# Patient Record
Sex: Female | Born: 1990 | Race: White | Hispanic: No | Marital: Single | Smoking: Current some day smoker
Health system: Southern US, Community
[De-identification: ages and names within clinical notes are randomized; demographics above are authoritative.]

## PROBLEM LIST (undated history)

## (undated) ENCOUNTER — Inpatient Hospital Stay (HOSPITAL_COMMUNITY): Payer: Self-pay

## (undated) DIAGNOSIS — O139 Gestational [pregnancy-induced] hypertension without significant proteinuria, unspecified trimester: Secondary | ICD-10-CM

## (undated) DIAGNOSIS — F419 Anxiety disorder, unspecified: Secondary | ICD-10-CM

## (undated) DIAGNOSIS — F32A Depression, unspecified: Secondary | ICD-10-CM

## (undated) DIAGNOSIS — F329 Major depressive disorder, single episode, unspecified: Secondary | ICD-10-CM

## (undated) HISTORY — DX: Anxiety disorder, unspecified: F41.9

## (undated) HISTORY — DX: Depression, unspecified: F32.A

## (undated) HISTORY — DX: Major depressive disorder, single episode, unspecified: F32.9

## (undated) HISTORY — DX: Gestational (pregnancy-induced) hypertension without significant proteinuria, unspecified trimester: O13.9

---

## 2008-03-28 ENCOUNTER — Inpatient Hospital Stay (HOSPITAL_COMMUNITY): Admission: AD | Admit: 2008-03-28 | Discharge: 2008-03-29 | Payer: Self-pay | Admitting: Obstetrics and Gynecology

## 2008-04-10 ENCOUNTER — Inpatient Hospital Stay (HOSPITAL_COMMUNITY): Admission: AD | Admit: 2008-04-10 | Discharge: 2008-04-13 | Payer: Self-pay | Admitting: Obstetrics and Gynecology

## 2008-04-10 ENCOUNTER — Encounter (INDEPENDENT_AMBULATORY_CARE_PROVIDER_SITE_OTHER): Payer: Self-pay | Admitting: Obstetrics and Gynecology

## 2011-03-29 NOTE — Discharge Summary (Signed)
NAME:  [name] [name], [name] [name]           ACCOUNT NO.:  [id number]   MEDICAL RECORD NO.:  [id number]          PATIENT TYPE:  INP   LOCATION:  9155                          FACILITY:  [facility]   PHYSICIAN:  Kendra H. Tenny Craw, MD     DATE OF BIRTH:  Sep 01, 1991   DATE OF ADMISSION:  03/28/2008  DATE OF DISCHARGE:  03/29/2008                               DISCHARGE SUMMARY   .   HOSPITAL DIAGNOSES:  1. Gestational hypertension.  2. A 34 and 6-week intrauterine pregnancy.   HOSPITAL PROCEDURES:  1. Ultrasound for fetal weight and amniotic fluid index.  2. Serial blood pressure monitoring.  3. Preeclampsia labs.  4. A 24-hour urine.  5. Continuous fetal monitoring.   HOSPITAL COURSE:  [name] is an 20 year old gravida 2, para 0-0-1-0  who was admitted on Mar 28, 2008, at 62 weeks' and 5 days gestational  age after being noted to have elevated blood pressures in the office of  148 over 90s to 110s.  She was completely asymptomatic at that time but  given that she had previously been normotensive during the pregnancy,  the decision was made to admit her for a 24-hour urine and serial blood  pressure monitoring for evaluation of possible preeclampsia.  Preeclampsia labs during this hospitalization were completely normal.  Blood pressures remained 130s to 140s over 80s to low 100.  A 24-hour  urine did return at 105 mg of protein for a 24-hour period.  During the  hospitalization, she did have headaches on 2 occasions which completely  resolved after taking oral Tylenol.  The transabdominal ultrasound which  was performed demonstrated an estimated fetal weight of 2132 g, 4 pounds  11 ounces which is 30th percentile with an amniotic fluid index of 14.8  and a cephalic presentation.  External fetal monitoring was reactive  without evidence of decelerations and no contractions were noted on the  monitor.  The results of the blood work and monitoring were discussed  with the patient on the evening of  Mar 29, 2008.  Given the normal blood  work and normal 24-hour urine and only elevated blood pressures, it was  felt that she had gestational hypertension and preeclampsia at this  time.  The patient does understand that preeclampsia can develop at any  time and it is a spectrum of disease.  She was instructed in the signs  and symptoms to be aware of and for need to return to the hospital.  She  will be started on labetalol 200 mg p.o. twice daily.  She will be  discharged on Mar 29, 2008, and start a 24-hour urine on Mar 30, 2008,  to be brought into the office on Mar 31, 2008.  She understands she will  need twice weekly monitoring.   LABS AT THE TIME OF DISCHARGE:  A 24-hour urine 105 mg of protein,  sodium 138, potassium 3.8, chloride 108, CO2 21, BUN 5, and creatinine  0.49.  AST 11, ALT 11, LDH 166, and uric acid 5.8.   DISCHARGE MEDICATIONS:  Labetalol 200 mg p.o. b.i.d.      Freddrick March.  Tenny Craw, MD  Electronically Signed     KHR/MEDQ  D:  03/29/2008  T:  03/30/2008  Job:  [id number]

## 2011-03-29 NOTE — Op Note (Signed)
NAME:  [name], [name]           ACCOUNT NO.:  [id number]   MEDICAL RECORD NO.:  [id number]          PATIENT TYPE:  INP   LOCATION:  9119                          FACILITY:  [facility]   PHYSICIAN:  Kendra H. Tenny Craw, MD     DATE OF BIRTH:  Apr 07, 1991   DATE OF PROCEDURE:  04/10/2008  DATE OF DISCHARGE:                               OPERATIVE REPORT   PREOPERATIVE DIAGNOSES:  1. A 36 and 4-week intrauterine pregnancy.  2. Gestational hypertension.  3. Nonreassuring fetal well being.   POSTOPERATIVE DIAGNOSES:  1. A 36 and 4-week intrauterine pregnancy.  2. Gestational hypertension.  3. Nonreassuring fetal well being.   PROCEDURE:  Primary low transverse cesarean section via Pfannenstiel  skin incision.   SURGEON:  Freddrick March. Tenny Craw, MD.   ASSISTANTLarene Beach, surgical tech   ANESTHESIA:  Epidural.   FINDINGS:  Vigorous female infant in the occiput posterior brow  presentation with Apgar scores of 8 at one minute and 9 at five minutes,  weighing 5 pounds 1 ounce.  Normal-appearing ovaries, tubes, and uterus.   PROCEDURE:  [name] [name] is an 20 year old G2, P0 at 8 weeks and 4 days  estimated gestational age who has been followed over the past 2 weeks  for gestational hypertension.  During the beginning of her pregnancy,  she was normotensive.  However, at her 34-week visit, she was noted to  have elevated blood pressures of 140/110.  She was admitted to the  hospital at this time for serial blood pressures and preeclampsia  evaluation.  A 24-hour urine was within normal limits at that time,  preeclampsia labs were normal.  Pressures remained elevated at 140s-  150s/90s-100s.  She was started on labetalol at that time.  She was  monitored for the next 2 weeks and continued to have elevated blood  pressures despite elevation of her labetalol dose to 400 mg twice daily.  So, the decision was made to proceed with induction of labor for just  worsening gestational hypertension.  She was  admitted early in the  morning on Apr 10, 2008, started with misoprostol induction, she made it  to 4 to 5 cm dilated.  However, after amniotomy was performed, the fetus  was noted to have intermittent variable decelerations with contractions.  Overall, scalp stimulation would demonstrate reassuring fetal well  being, but as time progressed, labor was adequate and these variables  began to become more pronounced and severe, and the decision was made to  proceed with a primary low transverse cesarean section for nonreassuring  fetal status.  Following the appropriate informed consent, the patient  was brought to the operating room where epidural anesthesia was found to  be adequate.  She was placed in the dorsal supine position in leftward  tilt and prepped and draped in the normal sterile fashion.  A scalpel  was used to make a Pfannenstiel skin incision, which was carried down  through the underlying layers of soft tissue to the fascia.  The fascia  was incised in the midline.  Fascial incision was extended laterally  with Mayo scissors.  Superior aspect of  the fascial incision was grasped  with Kocher clamps x2, tented up underlying rectus muscle was dissected  off sharply with the electrocautery unit.  The same procedure was  repeated on the inferior aspect of the fascial incision.  The rectus  muscles were then separated in the midline.  Abdominal peritoneum was  identified and entered sharply with the Metzenbaum.  The incision was  extended superiorly and inferiorly with good visualization of the  bladder.  The Alexis retractor was then inserted and erected.  The  vesicouterine peritoneum was identified, tented up, entered sharply with  the Metzenbaum, and the incision was extended laterally with the  Metzenbaum, and the bladder flap was created digitally.  A scalpel was  then used to make a low transverse incision on the uterus, which was  extended laterally with blunt dissection.   Upon making of the uterine  incision, the infant's chin, lips, and nose were immediately on the  incision, consistent with brow presentation.  The infant's head was  rotated to allow the occiput to be delivered first, followed by the  body.  No nuchal cord was noted at the time of delivery.  The infant  cried vigorously on the operative field.  Cord was clamped and cut, and  infant was passed to the awaiting pediatricians.  Placenta was then  spontaneously delivered.  The uterus was exteriorized and cleared of all  clot and debris.  Uterine incision was then repaired with #1 chromic in  a running locked fashion with a second imbricating layer.  The uterine  incision was inspected and found to be hemostatic.  The ovaries and  tubes were inspected and found to be within normal limits.  The uterus  was then returned to the abdominal cavity.  The abdominal cavity was  cleared of all clot and debris.  Uterine incision was reinspected and  found to be hemostatic.  The Alexis retractor was disassembled.  The  abdominal peritoneum was then reapproximated with 2-0 Vicryl.  The  rectus muscles were reapproximated with two figure-of-eight sutures of 2-  0 Vicryl.  The fascia was closed with 0-looped PDS and the skin was  closed with staples.  All sponge, lap, and needle counts were correct  x2.  The patient tolerated the procedure well and was brought to the  recovery room in stable condition following the procedure.      Freddrick March. Tenny Craw, MD  Electronically Signed     KHR/MEDQ  D:  04/10/2008  T:  04/11/2008  Job:  [id number]

## 2011-04-01 NOTE — Discharge Summary (Signed)
[name] [name], FAULDS           ACCOUNT NO.:  [id number]   MEDICAL RECORD NO.:  [id number]          PATIENT TYPE:  INP   LOCATION:  9119                          FACILITY:  [facility]   PHYSICIAN:  Randye Lobo, M.D.   DATE OF BIRTH:  1991-05-07   DATE OF ADMISSION:  04/10/2008  DATE OF DISCHARGE:  04/13/2008                               DISCHARGE SUMMARY   FINAL DIAGNOSES:  1. Intrauterine pregnancy at 36-4/[redacted] weeks gestation.  2. Gestational hypertension.  3. Induction of labor.  4. Nonreassuring fetal well-being.   PROCEDURE:  Primary low transverse cesarean section.   SURGEON:  Freddrick March. Tenny Craw, M.D.   ASSISTANTChip Boer as surgical technician.   COMPLICATIONS:  None.   INDICATIONS FOR PROCEDURE:  This 20 year old gravida 2, para 0, presents  at 36-4/[redacted] weeks gestation for an induction secondary to gestational  hypertension.  The patient has been followed ever since about [redacted] weeks  gestation with elevated blood pressures.  She has been admitted to the  hospital for serial blood pressures and preeclampsia evaluation.  She  had normal PIH labs and normal 24-hour urine.  The patient's blood  pressure stayed elevated and she was started on labetalol at that time.  She has continued to be monitored closely.  At this point the decision  was made to proceed with induction of labor secondary to the worsening  of her gestational hypertension.  Besides this current problem, the  patient's antepartum course up to about 34 weeks was uncomplicated.  She  did have a positive Group B Strep culture obtained in our office around  34 weeks.  The patient was admitted on May 28, started on misoprostol  induction.  She dilated about 4-5 cm, but after amniotomy was performed  the fetus was noted to have intermittent variable decelerations with her  contractions.  A discussion was held with the patient as the variables  became more severe and the decision was made to proceed with a cesarean  section  at this time.  The patient was still remote from delivery.  The  patient was taken to the operating room on Apr 10, 2008, by Dr. Tenny Craw for  a primary low transverse cesarean section which was performed with  delivery of a 5 pound 1 ounce female infant with Apgars of 8 and 9.  Delivery went without complications.  The patient's postoperative course  was benign without any significant fever.  She did continue to have some  elevated blood pressures and was kept on labetalol b.i.d.  She did want  her little boy circumcised prior to discharge.  She was felt ready for  discharge herself on postoperative day #2-1/2.  She was sent home on a  regular diet, told to decrease activities, told to continue her prenatal  vitamins, was given Percocet 1-2 every 4-6 hours as needed for pain,  told she could use ibuprofen up to 600 mg every 6 hours as needed for  pain.  She was to continue her labetalol as prescribed and was to follow  up in our office in 1 week for a blood pressure check.  Instructions and  precautions were reviewed with the patient.  The patient later reported  that she was unable to take Percocet.  Dr. Edward Jolly was made aware and  called in a different pain medicine for the patient.   DISCHARGE LABORATORY DATA:  Hemoglobin 10.8, white blood cell count  18.0, platelets 198,000.  Normal PIH panel.      Leilani Able, P.A.-C.      Randye Lobo, M.D.  Electronically Signed    MB/MEDQ  D:  05/07/2008  T:  05/07/2008  Job:  [id number]

## 2011-08-10 LAB — CBC
HCT: 30.9 — ABNORMAL LOW
HCT: 36.9
HCT: 37
HCT: 38.3
HCT: 39.3
Hemoglobin: 10.8 — ABNORMAL LOW
Hemoglobin: 12.8
Hemoglobin: 13
Hemoglobin: 13.5
Hemoglobin: 13.9
MCHC: 34.8
MCHC: 35
MCHC: 35
MCHC: 35.2
MCHC: 35.4
MCV: 90.9
MCV: 91.4
MCV: 91.9
MCV: 92
MCV: 92.5
Platelets: 198
Platelets: 238
Platelets: 252
Platelets: 252
Platelets: 255
RBC: 3.34 — ABNORMAL LOW
RBC: 4.01
RBC: 4.02
RBC: 4.21
RBC: 4.3
RDW: 13
RDW: 13.3
RDW: 13.4
RDW: 13.5
RDW: 13.8
WBC: 11.4 — ABNORMAL HIGH
WBC: 11.7 — ABNORMAL HIGH
WBC: 12.3 — ABNORMAL HIGH
WBC: 12.7 — ABNORMAL HIGH
WBC: 18 — ABNORMAL HIGH

## 2011-08-10 LAB — COMPREHENSIVE METABOLIC PANEL
ALT: 11
ALT: 13
AST: 10
AST: 12
Albumin: 2.6 — ABNORMAL LOW
Albumin: 2.8 — ABNORMAL LOW
Alkaline Phosphatase: 164 — ABNORMAL HIGH
BUN: 4 — ABNORMAL LOW
BUN: 5 — ABNORMAL LOW
CO2: 21
Calcium: 8.8
Calcium: 9.1
Chloride: 105
Chloride: 106
Creatinine, Ser: 0.49
Creatinine, Ser: 0.51
GFR calc Af Amer: >60
GFR calc non Af Amer: >60
Glucose, Bld: 106 — ABNORMAL HIGH
Potassium: 3.9
Sodium: 136
Total Bilirubin: 0.6

## 2011-08-10 LAB — URIC ACID: Uric Acid, Serum: 5.8

## 2011-08-10 LAB — LACTATE DEHYDROGENASE: LDH: 166

## 2011-08-10 LAB — CREATININE CLEARANCE, URINE, 24 HOUR
Collection Interval-CRCL: 24
Creatinine, 24H Ur: 740
Creatinine: 0.51
Urine Total Volume-CRCL: 1050

## 2011-08-10 LAB — RPR: RPR Ser Ql: NONREACTIVE

## 2015-04-17 ENCOUNTER — Emergency Department (HOSPITAL_COMMUNITY): Payer: No Typology Code available for payment source

## 2015-04-17 ENCOUNTER — Emergency Department (HOSPITAL_COMMUNITY)
Admission: EM | Admit: 2015-04-17 | Discharge: 2015-04-18 | Disposition: A | Discharge status: Home / Self Care | Payer: No Typology Code available for payment source | Attending: Emergency Medicine | Admitting: Emergency Medicine

## 2015-04-17 ENCOUNTER — Encounter (HOSPITAL_COMMUNITY): Payer: Self-pay | Admitting: *Deleted

## 2015-04-17 DIAGNOSIS — Y92410 Unspecified street and highway as the place of occurrence of the external cause: Secondary | ICD-10-CM | POA: Insufficient documentation

## 2015-04-17 DIAGNOSIS — Y9389 Activity, other specified: Secondary | ICD-10-CM | POA: Insufficient documentation

## 2015-04-17 DIAGNOSIS — S59902A Unspecified injury of left elbow, initial encounter: Secondary | ICD-10-CM | POA: Diagnosis not present

## 2015-04-17 DIAGNOSIS — T148 Other injury of unspecified body region: Secondary | ICD-10-CM | POA: Insufficient documentation

## 2015-04-17 DIAGNOSIS — R55 Syncope and collapse: Secondary | ICD-10-CM | POA: Insufficient documentation

## 2015-04-17 DIAGNOSIS — S299XXA Unspecified injury of thorax, initial encounter: Secondary | ICD-10-CM | POA: Insufficient documentation

## 2015-04-17 DIAGNOSIS — S3992XA Unspecified injury of lower back, initial encounter: Secondary | ICD-10-CM | POA: Insufficient documentation

## 2015-04-17 DIAGNOSIS — S59912A Unspecified injury of left forearm, initial encounter: Secondary | ICD-10-CM | POA: Diagnosis not present

## 2015-04-17 DIAGNOSIS — S6992XA Unspecified injury of left wrist, hand and finger(s), initial encounter: Secondary | ICD-10-CM | POA: Diagnosis not present

## 2015-04-17 DIAGNOSIS — S8991XA Unspecified injury of right lower leg, initial encounter: Secondary | ICD-10-CM | POA: Insufficient documentation

## 2015-04-17 DIAGNOSIS — Y999 Unspecified external cause status: Secondary | ICD-10-CM | POA: Insufficient documentation

## 2015-04-17 DIAGNOSIS — S199XXA Unspecified injury of neck, initial encounter: Secondary | ICD-10-CM | POA: Insufficient documentation

## 2015-04-17 MED ORDER — HYDROCODONE-ACETAMINOPHEN 5-325 MG PO TABS
2.0000 | ORAL_TABLET | Freq: Once | ORAL | Status: AC
  Administered 2015-04-17: 2 via ORAL
  Filled 2015-04-17: qty 2
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
## 2015-04-18 MED ORDER — HYDROCODONE-ACETAMINOPHEN 5-325 MG PO TABS: 1.0000 | ORAL_TABLET | Freq: Four times a day (QID) | ORAL | Status: DC | PRN

## 2015-04-18 NOTE — Discharge Instructions (Signed)

## 2016-03-10 ENCOUNTER — Emergency Department (HOSPITAL_COMMUNITY): Payer: Medicaid Other

## 2016-03-10 ENCOUNTER — Encounter (HOSPITAL_COMMUNITY): Payer: Self-pay | Admitting: *Deleted

## 2016-03-10 ENCOUNTER — Emergency Department (HOSPITAL_COMMUNITY)
Admission: EM | Admit: 2016-03-10 | Discharge: 2016-03-10 | Disposition: A | Discharge status: Home / Self Care | Payer: Medicaid Other | Attending: Emergency Medicine | Admitting: Emergency Medicine

## 2016-03-10 DIAGNOSIS — N390 Urinary tract infection, site not specified: Secondary | ICD-10-CM

## 2016-03-10 DIAGNOSIS — O2342 Unspecified infection of urinary tract in pregnancy, second trimester: Secondary | ICD-10-CM | POA: Insufficient documentation

## 2016-03-10 DIAGNOSIS — R000 Tachycardia, unspecified: Secondary | ICD-10-CM | POA: Diagnosis not present

## 2016-03-10 DIAGNOSIS — Z3A20 20 weeks gestation of pregnancy: Secondary | ICD-10-CM | POA: Insufficient documentation

## 2016-03-10 DIAGNOSIS — R102 Pelvic and perineal pain: Secondary | ICD-10-CM

## 2016-03-10 DIAGNOSIS — O9989 Other specified diseases and conditions complicating pregnancy, childbirth and the puerperium: Secondary | ICD-10-CM | POA: Diagnosis present

## 2016-03-10 LAB — LIPASE, BLOOD: LIPASE: 20 U/L (ref 11–51)

## 2016-03-10 LAB — COMPREHENSIVE METABOLIC PANEL
ALT: 12 U/L — ABNORMAL LOW (ref 14–54)
ANION GAP: 10 (ref 5–15)
AST: 9 U/L — ABNORMAL LOW (ref 15–41)
Albumin: 3.2 g/dL — ABNORMAL LOW (ref 3.5–5.0)
Alkaline Phosphatase: 65 U/L (ref 38–126)
BUN: 6 mg/dL (ref 6–20)
CHLORIDE: 104 mmol/L (ref 101–111)
CO2: 22 mmol/L (ref 22–32)
Calcium: 9.3 mg/dL (ref 8.9–10.3)
Creatinine, Ser: 0.44 mg/dL (ref 0.44–1.00)
GFR calc Af Amer: >60 mL/min (ref >60)
GFR calc non Af Amer: >60 mL/min (ref >60)
Glucose, Bld: 80 mg/dL (ref 65–99)
POTASSIUM: 4.4 mmol/L (ref 3.5–5.1)
SODIUM: 136 mmol/L (ref 135–145)
Total Bilirubin: 0.4 mg/dL (ref 0.3–1.2)
Total Protein: 6.8 g/dL (ref 6.5–8.1)

## 2016-03-10 LAB — CBC
HCT: 39.3 % (ref 36.0–46.0)
HEMOGLOBIN: 13.5 g/dL (ref 12.0–15.0)
MCH: 31 pg (ref 26.0–34.0)
MCHC: 34.4 g/dL (ref 30.0–36.0)
MCV: 90.1 fL (ref 78.0–100.0)
Platelets: 342 10*3/uL (ref 150–400)
RBC: 4.36 MIL/uL (ref 3.87–5.11)
RDW: 13.1 % (ref 11.5–15.5)
WBC: 17 10*3/uL — AB (ref 4.0–10.5)

## 2016-03-10 LAB — URINALYSIS, ROUTINE W REFLEX MICROSCOPIC
Bilirubin Urine: NEGATIVE
GLUCOSE, UA: NEGATIVE mg/dL
HGB URINE DIPSTICK: NEGATIVE
Ketones, ur: NEGATIVE mg/dL
LEUKOCYTES UA: NEGATIVE
Nitrite: POSITIVE — AB
Protein, ur: NEGATIVE mg/dL
SPECIFIC GRAVITY, URINE: 1.013 (ref 1.005–1.030)
pH: 7.5 (ref 5.0–8.0)

## 2016-03-10 LAB — I-STAT BETA HCG BLOOD, ED (MC, WL, AP ONLY)

## 2016-03-10 LAB — URINE MICROSCOPIC-ADD ON

## 2016-03-10 MED ORDER — CEPHALEXIN 500 MG PO CAPS: 500.0000 mg | ORAL_CAPSULE | Freq: Four times a day (QID) | ORAL | Status: DC

## 2016-03-10 MED ORDER — DEXTROSE 5 % IV SOLN
1.0000 g | Freq: Once | INTRAVENOUS | Status: AC
  Administered 2016-03-10: 1 g via INTRAVENOUS
  Filled 2016-03-10: qty 10

## 2016-03-10 MED ORDER — ACETAMINOPHEN 500 MG PO TABS
1000.0000 mg | ORAL_TABLET | Freq: Once | ORAL | Status: AC
  Administered 2016-03-10: 1000 mg via ORAL
  Filled 2016-03-10: qty 2

## 2016-03-10 MED ORDER — SODIUM CHLORIDE 0.9 % IV BOLUS (SEPSIS)
1000.0000 mL | Freq: Once | INTRAVENOUS | Status: AC
  Administered 2016-03-10: 1000 mL via INTRAVENOUS

## 2016-03-10 NOTE — ED Provider Notes (Signed)
CSN: [id number]     Arrival date & time 03/10/16  1235 History   First MD Initiated Contact with Patient 03/10/16 1323     Chief Complaint  Patient presents with  . Abdominal Pain     (Consider location/radiation/quality/duration/timing/severity/associated sxs/prior Treatment) HPI Comments: 25 year old female G4 P3 presents for abdominal pain. The patient states that her last menstrual period she believes was December 15 and although she is not sure if it was normal she thinks it probably was. She believes herself to be about 19-[redacted] weeks gestation. She has not had any prenatal care secondary to insurance issues. She reports that her other deliveries were by C-section after the first one was an emergent delivery secondary to preeclampsia. She denies any vaginal bleeding or gushes of fluid. She reports that she does have intermittent cramping pains in her back like a menstrual period. She says that her urine has been very dark and foul-smelling. She also reports that she has at times had difficulty initiating urination. She denies known fever. She has been nauseous and has vomited on occasion. She reports bilateral flank pain worse on the left that is intermittent and seems to wax and wane.  Patient is a 25 y.o. female presenting with abdominal pain.  Abdominal Pain Associated symptoms: dysuria   Associated symptoms: no chest pain, no chills, no fatigue, no fever and no vaginal bleeding     History reviewed. No pertinent past medical history. History reviewed. No pertinent past surgical history. No family history on file. Social History  Substance Use Topics  . Smoking status: Never Smoker   . Smokeless tobacco: None  . Alcohol Use: Yes   OB History    No data available     Review of Systems  Constitutional: Negative for fever, chills and fatigue.  HENT: Negative for congestion, postnasal drip, rhinorrhea and sinus pressure.   Cardiovascular: Negative for chest pain and palpitations.   Gastrointestinal: Positive for abdominal pain.  Genitourinary: Positive for dysuria and flank pain (bilateral, intermittent). Negative for urgency, decreased urine volume, vaginal bleeding, vaginal pain and menstrual problem.  Musculoskeletal: Negative for myalgias and back pain.  Skin: Negative for rash.  Neurological: Negative for dizziness, weakness and headaches.  Hematological: Does not bruise/bleed easily.      Allergies  Morphine and related  Home Medications   Prior to Admission medications   Medication Sig Start Date End Date Taking? Authorizing Provider  HYDROcodone-acetaminophen (NORCO/VICODIN) 5-325 MG per tablet Take 1-2 tablets by mouth every 6 (six) hours as needed. 04/18/15   Montine Circle, PA-C   BP 118/83 mmHg  Pulse 112  Temp(Src) 97.7 F (36.5 C) (Oral)  Resp 16  SpO2 100%  LMP 11/10/2015 Physical Exam  Constitutional: She is oriented to person, place, and time. She appears well-developed and well-nourished. No distress.  HENT:  Head: Normocephalic and atraumatic.  Right Ear: External ear normal.  Left Ear: External ear normal.  Nose: Nose normal.  Mouth/Throat: Oropharynx is clear and moist. No oropharyngeal exudate.  Eyes: EOM are normal. Pupils are equal, round, and reactive to light.  Neck: Normal range of motion. Neck supple.  Cardiovascular: Regular rhythm, normal heart sounds and intact distal pulses.  Tachycardia present.   No murmur heard. Pulmonary/Chest: Effort normal. No respiratory distress. She has no wheezes. She has no rales.  Abdominal: Soft. There is no tenderness. There is no CVA tenderness.  Gravid to around the umbilicus.    Musculoskeletal: Normal range of motion. She exhibits no edema or  tenderness.  Neurological: She is alert and oriented to person, place, and time.  Skin: Skin is warm and dry. No rash noted. She is not diaphoretic.  Vitals reviewed.   ED Course  Procedures (including critical care time) Labs Review Labs  Reviewed  COMPREHENSIVE METABOLIC PANEL - Abnormal; Notable for the following:    Albumin 3.2 (*)    AST 9 (*)    ALT 12 (*)    All other components within normal limits  CBC - Abnormal; Notable for the following:    WBC 17.0 (*)    All other components within normal limits  I-STAT BETA HCG BLOOD, ED ([facility], [facility], [facility] ONLY) - Abnormal; Notable for the following:    I-stat hCG, quantitative >2000.0 (*)    All other components within normal limits  LIPASE, BLOOD  URINALYSIS, ROUTINE W REFLEX MICROSCOPIC (NOT AT [facility])    Imaging Review No results found. I have personally reviewed and evaluated these images and lab results as part of my medical decision-making.   EKG Interpretation None      MDM  Patient was seen and evaluated in stable condition.  Bedside US with IUP and FHR in the 150s.  [facility].  UA consistent with infection.  Renal US without sign of kidney stone.  OB US with dating about 51 w 1 day.  Discussed again with Dr. Ihor Dow at [facility] who recommended IV dose of antibiotics and discharge with prescription.  She said she would facilitate an outpatient initial OB appointment for the patient.  Discussed plan of care with patient who was in agreement.  Patient to be discharged with Keflex.  Strict return precautions given. Final diagnoses:  None    1. UTI/early pyelo    Harvel Quale, MD 03/10/16 [phone]

## 2016-03-10 NOTE — Discharge Instructions (Signed)
You were seen and evaluated today for your pain and change in urination.  This appears likely secondary to a UTI/early kidney infection.  If your symptoms worsen or you are not able to hold down your antibiotics or you develop fever then follow up at the [facility] hospital.  The OB office from the women's hospital should be contacting you to arrange an initial OB appointment.  Pyelonephritis, Adult Pyelonephritis is a kidney infection. The kidneys are the organs that filter a person's blood and move waste out of the bloodstream and into the urine. Urine passes from the kidneys, through the ureters, and into the bladder. There are two main types of pyelonephritis:  Infections that come on quickly without any warning (acute pyelonephritis).  Infections that last for a long period of time (chronic pyelonephritis). In most cases, the infection clears up with treatment and does not cause further problems. More severe infections or chronic infections can sometimes spread to the bloodstream or lead to other problems with the kidneys. CAUSES This condition is usually caused by:  Bacteria traveling from the bladder to the kidney through infected urine. The urine in the bladder can become infected with bacteria from:  Bladder infection (cystitis).  Inflammation of the prostate gland (prostatitis).  Sexual intercourse, in females.  Bacteria traveling from the bloodstream to the kidney. RISK FACTORS This condition is more likely to develop in:  Pregnant women.  Older people.  People who have diabetes.  People who have kidney stones or bladder stones.  People who have other abnormalities of the kidney or ureter.  People who have a catheter placed in the bladder.  People who have cancer.  People who are sexually active.  Women who use spermicides.  People who have had a prior urinary tract infection. SYMPTOMS Symptoms of this condition include:  Frequent urination.  Strong or  persistent urge to urinate.  Burning or stinging when urinating.  Abdominal pain.  Back pain.  Pain in the side or flank area.  Fever.  Chills.  Blood in the urine, or dark urine.  Nausea.  Vomiting. DIAGNOSIS This condition may be diagnosed based on:  Medical history and physical exam.  Urine tests.  Blood tests. You may also have imaging tests of the kidneys, such as an ultrasound or CT scan. TREATMENT Treatment for this condition may depend on the severity of the infection.  If the infection is mild and is found early, you may be treated with antibiotic medicines taken by mouth. You will need to drink fluids to remain hydrated.  If the infection is more severe, you may need to stay in the hospital and receive antibiotics given directly into a vein through an IV tube. You may also need to receive fluids through an IV tube if you are not able to remain hydrated. After your hospital stay, you may need to take oral antibiotics for a period of time. Other treatments may be required, depending on the cause of the infection. HOME CARE INSTRUCTIONS Medicines  Take over-the-counter and prescription medicines only as told by your health care provider.  If you were prescribed an antibiotic medicine, take it as told by your health care provider. Do not stop taking the antibiotic even if you start to feel better. General Instructions  Drink enough fluid to keep your urine clear or pale yellow.  Avoid caffeine, tea, and carbonated beverages. They tend to irritate the bladder.  Urinate often. Avoid holding in urine for long periods of time.  Urinate before  and after sex.  After a bowel movement, women should cleanse from front to back. Use each tissue only once.  Keep all follow-up visits as told by your health care provider. This is important. SEEK MEDICAL CARE IF:  Your symptoms do not get better after 2 days of treatment.  Your symptoms get worse.  You have a  fever. SEEK IMMEDIATE MEDICAL CARE IF:  You are unable to take your antibiotics or fluids.  You have shaking chills.  You vomit.  You have severe flank or back pain.  You have extreme weakness or fainting.   This information is not intended to replace advice given to you by your health care provider. Make sure you discuss any questions you have with your health care provider.   Document Released: 10/31/2005 Document Revised: 07/22/2015 Document Reviewed: 02/23/2015 Elsevier Interactive Patient Education [employer].

## 2016-03-10 NOTE — ED Notes (Signed)
Pt reports abdominal pain, dizziness, N/V, chills. Pt is approx [redacted] weeks pregnant. Pt denies vaginal bleeding. Pt has not had previous prenatal care. LMP October 28 2015

## 2016-03-24 ENCOUNTER — Encounter: Payer: Self-pay | Admitting: Obstetrics & Gynecology

## 2016-03-24 ENCOUNTER — Encounter: Payer: Medicaid Other | Admitting: Obstetrics & Gynecology

## 2016-04-25 ENCOUNTER — Encounter: Payer: Medicaid Other | Admitting: Family Medicine

## 2016-04-26 ENCOUNTER — Encounter (HOSPITAL_COMMUNITY): Payer: Self-pay | Admitting: *Deleted

## 2016-04-26 ENCOUNTER — Inpatient Hospital Stay (HOSPITAL_COMMUNITY)
Admission: AD | Admit: 2016-04-26 | Discharge: 2016-04-26 | Disposition: A | Discharge status: Home / Self Care | Payer: Medicaid Other | Source: Ambulatory Visit | Attending: Obstetrics & Gynecology | Admitting: Obstetrics & Gynecology

## 2016-04-26 DIAGNOSIS — E876 Hypokalemia: Secondary | ICD-10-CM

## 2016-04-26 DIAGNOSIS — R059 Cough, unspecified: Secondary | ICD-10-CM

## 2016-04-26 DIAGNOSIS — O99282 Endocrine, nutritional and metabolic diseases complicating pregnancy, second trimester: Secondary | ICD-10-CM | POA: Insufficient documentation

## 2016-04-26 DIAGNOSIS — O9989 Other specified diseases and conditions complicating pregnancy, childbirth and the puerperium: Secondary | ICD-10-CM | POA: Diagnosis not present

## 2016-04-26 DIAGNOSIS — G5600 Carpal tunnel syndrome, unspecified upper limb: Secondary | ICD-10-CM | POA: Diagnosis not present

## 2016-04-26 DIAGNOSIS — Z3A27 27 weeks gestation of pregnancy: Secondary | ICD-10-CM | POA: Insufficient documentation

## 2016-04-26 DIAGNOSIS — J069 Acute upper respiratory infection, unspecified: Secondary | ICD-10-CM

## 2016-04-26 DIAGNOSIS — O99512 Diseases of the respiratory system complicating pregnancy, second trimester: Secondary | ICD-10-CM | POA: Diagnosis not present

## 2016-04-26 DIAGNOSIS — O368120 Decreased fetal movements, second trimester, not applicable or unspecified: Secondary | ICD-10-CM | POA: Diagnosis present

## 2016-04-26 DIAGNOSIS — G5603 Carpal tunnel syndrome, bilateral upper limbs: Secondary | ICD-10-CM

## 2016-04-26 DIAGNOSIS — R05 Cough: Secondary | ICD-10-CM

## 2016-04-26 LAB — COMPREHENSIVE METABOLIC PANEL
ALT: 12 U/L — ABNORMAL LOW (ref 14–54)
ANION GAP: 7 (ref 5–15)
AST: 12 U/L — ABNORMAL LOW (ref 15–41)
Albumin: 2.5 g/dL — ABNORMAL LOW (ref 3.5–5.0)
Alkaline Phosphatase: 83 U/L (ref 38–126)
BILIRUBIN TOTAL: 0.5 mg/dL (ref 0.3–1.2)
BUN: <5 mg/dL — ABNORMAL LOW (ref 6–20)
CHLORIDE: 104 mmol/L (ref 101–111)
CO2: 22 mmol/L (ref 22–32)
Calcium: 8.7 mg/dL — ABNORMAL LOW (ref 8.9–10.3)
Creatinine, Ser: 0.44 mg/dL (ref 0.44–1.00)
GFR calc Af Amer: >60 mL/min (ref >60)
GFR calc non Af Amer: >60 mL/min (ref >60)
GLUCOSE: 123 mg/dL — AB (ref 65–99)
POTASSIUM: 2.7 mmol/L — AB (ref 3.5–5.1)
SODIUM: 133 mmol/L — AB (ref 135–145)
TOTAL PROTEIN: 6 g/dL — AB (ref 6.5–8.1)

## 2016-04-26 LAB — CBC
HEMATOCRIT: 31.3 % — AB (ref 36.0–46.0)
Hemoglobin: 11.1 g/dL — ABNORMAL LOW (ref 12.0–15.0)
MCH: 30.7 pg (ref 26.0–34.0)
MCHC: 35.5 g/dL (ref 30.0–36.0)
MCV: 86.7 fL (ref 78.0–100.0)
PLATELETS: 329 10*3/uL (ref 150–400)
RBC: 3.61 MIL/uL — ABNORMAL LOW (ref 3.87–5.11)
RDW: 13.2 % (ref 11.5–15.5)
WBC: 13.4 10*3/uL — ABNORMAL HIGH (ref 4.0–10.5)

## 2016-04-26 LAB — URINALYSIS, ROUTINE W REFLEX MICROSCOPIC
BILIRUBIN URINE: NEGATIVE
GLUCOSE, UA: NEGATIVE mg/dL
HGB URINE DIPSTICK: NEGATIVE
KETONES UR: NEGATIVE mg/dL
LEUKOCYTES UA: NEGATIVE
Nitrite: NEGATIVE
PROTEIN: NEGATIVE mg/dL
pH: 6.5 (ref 5.0–8.0)

## 2016-04-26 LAB — PROTEIN / CREATININE RATIO, URINE
Creatinine, Urine: 94 mg/dL
PROTEIN CREATININE RATIO: 0.1 mg/mg{creat} (ref 0.00–0.15)
Total Protein, Urine: 9 mg/dL

## 2016-04-26 MED ORDER — POTASSIUM CHLORIDE ER 10 MEQ PO TBCR: 40.0000 meq | EXTENDED_RELEASE_TABLET | Freq: Every day | ORAL | Status: DC

## 2016-04-26 MED ORDER — DM-GUAIFENESIN ER 30-600 MG PO TB12
1.0000 | ORAL_TABLET | Freq: Two times a day (BID) | ORAL | Status: DC
  Administered 2016-04-26: 1 via ORAL
  Filled 2016-04-26 (×2): qty 1

## 2016-04-26 MED ORDER — BENZONATATE 100 MG PO CAPS: 100.0000 mg | ORAL_CAPSULE | Freq: Three times a day (TID) | ORAL | Status: DC | PRN

## 2016-04-26 MED ORDER — DM-GUAIFENESIN ER 30-600 MG PO TB12: 1.0000 | ORAL_TABLET | Freq: Two times a day (BID) | ORAL | Status: DC | PRN

## 2016-04-26 NOTE — MAU Note (Signed)
Pt presents to MAU with complaints of cold symptoms, tingling in her hands.  Decrease in fetal movement. Reports no PNC due to not being able to find a place that will accept insurance. PT states she went to [facility] in April and was given an U/S and was told the clinic would call her for an appointment and she states she has never been given a call. Arminda Resides

## 2016-04-26 NOTE — MAU Note (Signed)
Urine sent to lab 

## 2016-04-26 NOTE — Discharge Instructions (Signed)

## 2016-04-26 NOTE — MAU Provider Note (Signed)
History     CSN: [id number]  Arrival date and time: 04/26/16 1423   None     Chief Complaint  Patient presents with  . Decreased Fetal Movement  . URI   HPI Pt is [redacted]w[redacted]d [id number] no PNC due to insurance issues (Medicaid)- c/s x2 Pt c/o of tingling and numbness in her hands, right worse than left Pt has a dry hacking cough for 4 days; nose stuffy; has tried flonase No fever or chills. Has not felt the baby move  Pt was seen in April (03/10/16)at [facility] for abd pain and possible early pyelonephritis-no urine culture found in labs Pt was to be referred to [facility] but never received a phone call.- Dr. Velora Heckler discussed with Dr. Ihor Dow to facilitate outpatient OB appointment.  Pt tried to contact clinic and LM but states didn't return phone call Pt denies spotting or bleeding. Pt has hx of preeclampsia del at 37 weeks  Expand All Collapse All   Pt presents to MAU with complaints of cold symptoms, tingling in her hands. Decrease in fetal movement. Reports no PNC due to not being able to find a place that will accept insurance. PT states she went to [facility] in April and was given an U/S and was told the clinic would call her for an appointment and she states she has never been given a call. Den        History reviewed. No pertinent past medical history.  Past Surgical History  Procedure Laterality Date  . Cesarean section      History reviewed. No pertinent family history.  Social History  Substance Use Topics  . Smoking status: Never Smoker   . Smokeless tobacco: Never Used  . Alcohol Use: No    Allergies:  Allergies  Allergen Reactions  . Morphine And Related Nausea And Vomiting    Prescriptions prior to admission  Medication Sig Dispense Refill Last Dose  . amphetamine-dextroamphetamine (ADDERALL) 20 MG tablet Take 20 mg by mouth daily.  0 Past Week at Unknown time  . Prenatal Vit-Fe Fumarate-FA (MULTIVITAMIN-PRENATAL) 27-0.8 MG TABS tablet Take 1 tablet  by mouth daily at 12 noon.   Past Week at Unknown time  . cephALEXin (KEFLEX) 500 MG capsule Take 1 capsule (500 mg total) by mouth 4 (four) times daily. (Patient not taking: Reported on 04/26/2016) 28 capsule 0 Completed Course at Unknown time    Review of Systems  Constitutional: Negative for fever and chills.  HENT: Positive for congestion.   Respiratory: Positive for cough.        Dry hacking  Cardiovascular: Negative for chest pain.  Gastrointestinal: Negative for nausea, vomiting, abdominal pain and diarrhea.  Genitourinary: Negative for dysuria.  Neurological: Positive for headaches.   Physical Exam   Blood pressure 135/93, pulse 110, temperature 98.4 F (36.9 C), resp. rate 16, weight 201 lb (91.173 kg), last menstrual period 11/10/2015.  Physical Exam  Vitals reviewed. Constitutional: She is oriented to person, place, and time. She appears well-developed.  HENT:  Head: Normocephalic.  Eyes: Pupils are equal, round, and reactive to light.  Neck: Normal range of motion. Neck supple.  Cardiovascular: Normal rate, regular rhythm and normal heart sounds.   Respiratory: Effort normal. No respiratory distress. She has no wheezes. She has no rales.  GI: Soft. She exhibits no distension. There is no tenderness. There is no rebound.  Musculoskeletal: Normal range of motion.  Bilateral numbness in hands Has bilateral equal grips  Neurological: She is  alert and oriented to person, place, and time.  Skin: Skin is warm and dry.  Psychiatric: She has a normal mood and affect.    MAU Course  Procedures Hx of preeclampsia; elevated BP today PIH labs drawn Mucinex DM given for cough/congestion Results for orders placed or performed during the hospital encounter of 04/26/16 (from the past 24 hour(s))  Urinalysis, Routine w reflex microscopic (not at [facility])     Status: Abnormal   Collection Time: 04/26/16  2:30 PM  Result Value Ref Range   Color, Urine YELLOW YELLOW   APPearance  CLEAR CLEAR   Specific Gravity, Urine <1.005 (L) 1.005 - 1.030   pH 6.5 5.0 - 8.0   Glucose, UA NEGATIVE NEGATIVE mg/dL   Hgb urine dipstick NEGATIVE NEGATIVE   Bilirubin Urine NEGATIVE NEGATIVE   Ketones, ur NEGATIVE NEGATIVE mg/dL   Protein, ur NEGATIVE NEGATIVE mg/dL   Nitrite NEGATIVE NEGATIVE   Leukocytes, UA NEGATIVE NEGATIVE  Protein / creatinine ratio, urine     Status: None   Collection Time: 04/26/16  2:30 PM  Result Value Ref Range   Creatinine, Urine 94.00 mg/dL   Total Protein, Urine 9 mg/dL   Protein Creatinine Ratio 0.10 0.00 - 0.15 mg/mg[Cre]  CBC     Status: Abnormal   Collection Time: 04/26/16  3:24 PM  Result Value Ref Range   WBC 13.4 (H) 4.0 - 10.5 K/uL   RBC 3.61 (L) 3.87 - 5.11 MIL/uL   Hemoglobin 11.1 (L) 12.0 - 15.0 g/dL   HCT 31.3 (L) 36.0 - 46.0 %   MCV 86.7 78.0 - 100.0 fL   MCH 30.7 26.0 - 34.0 pg   MCHC 35.5 30.0 - 36.0 g/dL   RDW 13.2 11.5 - 15.5 %   Platelets 329 150 - 400 K/uL  Comprehensive metabolic panel     Status: Abnormal   Collection Time: 04/26/16  3:24 PM  Result Value Ref Range   Sodium 133 (L) 135 - 145 mmol/L   Potassium 2.7 (LL) 3.5 - 5.1 mmol/L   Chloride 104 101 - 111 mmol/L   CO2 22 22 - 32 mmol/L   Glucose, Bld 123 (H) 65 - 99 mg/dL   BUN <5 (L) 6 - 20 mg/dL   Creatinine, Ser 0.44 0.44 - 1.00 mg/dL   Calcium 8.7 (L) 8.9 - 10.3 mg/dL   Total Protein 6.0 (L) 6.5 - 8.1 g/dL   Albumin 2.5 (L) 3.5 - 5.0 g/dL   AST 12 (L) 15 - 41 U/L   ALT 12 (L) 14 - 54 U/L   Alkaline Phosphatase 83 38 - 126 U/L   Total Bilirubin 0.5 0.3 - 1.2 mg/dL   GFR calc non Af Amer >60 >60 mL/min   GFR calc Af Amer >60 >60 mL/min   Anion gap 7 5 - 15  discussed with Dr. Roselie Awkward- no reason for hypokalemia Will send home on K+ 40 mEq daily for 3 days Note sent to [facility] to contact pt for appointment ASAP Reactive NST; 15x15 accelerations no decelerations 6-25 bpm variability; baseline 135 bpm BP range 135/93 on admission; 137/85 at discharge Assessment  and Plan  URI in pregnancy- Mucinex DM Cough- Tessalon perles Rx Viable IUP [redacted]w[redacted]d no PNC - note to [place] counts Carpal tunnel - wrist splints Hx of preeclampsia- warnings discussed Hypokalemia- unknown cause- K+ 40 mEq daily for 3 days #12 F/u in [place] 04/26/2016, 3:07 PM

## 2016-04-26 NOTE — MAU Note (Signed)
Sherolyn Buba  NP notified of Critical potassium 2.7

## 2016-05-18 ENCOUNTER — Encounter: Payer: Self-pay | Admitting: Advanced Practice Midwife

## 2016-05-18 ENCOUNTER — Inpatient Hospital Stay (HOSPITAL_COMMUNITY)
Admission: AD | Admit: 2016-05-18 | Discharge: 2016-05-19 | Disposition: A | Discharge status: Home / Self Care | Payer: Medicaid Other | Source: Ambulatory Visit | Attending: Obstetrics and Gynecology | Admitting: Obstetrics and Gynecology

## 2016-05-18 ENCOUNTER — Encounter (HOSPITAL_COMMUNITY): Payer: Self-pay | Admitting: *Deleted

## 2016-05-18 DIAGNOSIS — R51 Headache: Secondary | ICD-10-CM | POA: Diagnosis not present

## 2016-05-18 DIAGNOSIS — Z3A31 31 weeks gestation of pregnancy: Secondary | ICD-10-CM

## 2016-05-18 DIAGNOSIS — O9989 Other specified diseases and conditions complicating pregnancy, childbirth and the puerperium: Secondary | ICD-10-CM

## 2016-05-18 DIAGNOSIS — R197 Diarrhea, unspecified: Secondary | ICD-10-CM | POA: Diagnosis not present

## 2016-05-18 DIAGNOSIS — O163 Unspecified maternal hypertension, third trimester: Secondary | ICD-10-CM

## 2016-05-18 DIAGNOSIS — G43009 Migraine without aura, not intractable, without status migrainosus: Secondary | ICD-10-CM | POA: Diagnosis not present

## 2016-05-18 DIAGNOSIS — R030 Elevated blood-pressure reading, without diagnosis of hypertension: Secondary | ICD-10-CM

## 2016-05-18 DIAGNOSIS — Z885 Allergy status to narcotic agent status: Secondary | ICD-10-CM | POA: Diagnosis not present

## 2016-05-18 DIAGNOSIS — Z98891 History of uterine scar from previous surgery: Secondary | ICD-10-CM | POA: Insufficient documentation

## 2016-05-18 DIAGNOSIS — O133 Gestational [pregnancy-induced] hypertension without significant proteinuria, third trimester: Secondary | ICD-10-CM | POA: Diagnosis not present

## 2016-05-18 DIAGNOSIS — O210 Mild hyperemesis gravidarum: Secondary | ICD-10-CM | POA: Diagnosis present

## 2016-05-18 NOTE — MAU Note (Addendum)
PT SAYS  SHE HAS HAD NPC-   WAS  HERE  IN June-   .   HAS AN APPOINTMENT   ON 7-12     WITH      [address]   [facility].         VOMITING  X2 TODAY  ,   HEADACHE  STARTED   Sunday-   TOOK  XS TYLENOL   2  TABS -   SOME  RELIEF,    BLURRED  VISION  STARTED  WITH H/A.,        CRAMPS STARTED  ON Sunday...     HAD  CRAMPS  WHEN HERE BEFORE.          LAST SEX-  YESTERDAY.      DENIES  HSV .     HAD MRSA- 2007.      HANDS  HURT  AS BEFORE

## 2016-05-19 ENCOUNTER — Inpatient Hospital Stay (HOSPITAL_COMMUNITY)
Admission: AD | Admit: 2016-05-19 | Discharge: 2016-05-19 | Disposition: A | Discharge status: Home / Self Care | Payer: Medicaid Other | Source: Ambulatory Visit | Attending: Obstetrics and Gynecology | Admitting: Obstetrics and Gynecology

## 2016-05-19 ENCOUNTER — Encounter (HOSPITAL_COMMUNITY): Payer: Self-pay

## 2016-05-19 DIAGNOSIS — R51 Headache: Secondary | ICD-10-CM | POA: Insufficient documentation

## 2016-05-19 DIAGNOSIS — Z3A31 31 weeks gestation of pregnancy: Secondary | ICD-10-CM | POA: Diagnosis not present

## 2016-05-19 DIAGNOSIS — O9989 Other specified diseases and conditions complicating pregnancy, childbirth and the puerperium: Secondary | ICD-10-CM

## 2016-05-19 DIAGNOSIS — G43009 Migraine without aura, not intractable, without status migrainosus: Secondary | ICD-10-CM

## 2016-05-19 DIAGNOSIS — Z885 Allergy status to narcotic agent status: Secondary | ICD-10-CM | POA: Insufficient documentation

## 2016-05-19 DIAGNOSIS — O210 Mild hyperemesis gravidarum: Secondary | ICD-10-CM | POA: Insufficient documentation

## 2016-05-19 DIAGNOSIS — O34219 Maternal care for unspecified type scar from previous cesarean delivery: Secondary | ICD-10-CM

## 2016-05-19 DIAGNOSIS — O133 Gestational [pregnancy-induced] hypertension without significant proteinuria, third trimester: Secondary | ICD-10-CM | POA: Diagnosis not present

## 2016-05-19 DIAGNOSIS — R197 Diarrhea, unspecified: Secondary | ICD-10-CM | POA: Insufficient documentation

## 2016-05-19 DIAGNOSIS — O139 Gestational [pregnancy-induced] hypertension without significant proteinuria, unspecified trimester: Secondary | ICD-10-CM

## 2016-05-19 DIAGNOSIS — Z98891 History of uterine scar from previous surgery: Secondary | ICD-10-CM | POA: Insufficient documentation

## 2016-05-19 LAB — URINALYSIS, ROUTINE W REFLEX MICROSCOPIC
BILIRUBIN URINE: NEGATIVE
BILIRUBIN URINE: NEGATIVE
GLUCOSE, UA: NEGATIVE mg/dL
Glucose, UA: NEGATIVE mg/dL
HGB URINE DIPSTICK: NEGATIVE
HGB URINE DIPSTICK: NEGATIVE
KETONES UR: NEGATIVE mg/dL
KETONES UR: NEGATIVE mg/dL
LEUKOCYTES UA: NEGATIVE
Leukocytes, UA: NEGATIVE
Nitrite: NEGATIVE
Nitrite: NEGATIVE
PH: 6 (ref 5.0–8.0)
PROTEIN: NEGATIVE mg/dL
Protein, ur: NEGATIVE mg/dL
SPECIFIC GRAVITY, URINE: 1.02 (ref 1.005–1.030)
Specific Gravity, Urine: 1.01 (ref 1.005–1.030)
pH: 6.5 (ref 5.0–8.0)

## 2016-05-19 LAB — CBC
HCT: 37.1 % (ref 36.0–46.0)
HCT: 36.5 % (ref 36.0–46.0)
HEMOGLOBIN: 12.9 g/dL (ref 12.0–15.0)
Hemoglobin: 13.2 g/dL (ref 12.0–15.0)
MCH: 31 pg (ref 26.0–34.0)
MCH: 31.7 pg (ref 26.0–34.0)
MCHC: 35.6 g/dL (ref 30.0–36.0)
MCHC: 35.3 g/dL (ref 30.0–36.0)
MCV: 87.7 fL (ref 78.0–100.0)
MCV: 89.2 fL (ref 78.0–100.0)
PLATELETS: 362 10*3/uL (ref 150–400)
PLATELETS: 386 10*3/uL (ref 150–400)
RBC: 4.16 MIL/uL (ref 3.87–5.11)
RBC: 4.16 MIL/uL (ref 3.87–5.11)
RDW: 13.6 % (ref 11.5–15.5)
RDW: 14 % (ref 11.5–15.5)
WBC: 16.4 10*3/uL — ABNORMAL HIGH (ref 4.0–10.5)
WBC: 13.4 10*3/uL — ABNORMAL HIGH (ref 4.0–10.5)

## 2016-05-19 LAB — COMPREHENSIVE METABOLIC PANEL
ALBUMIN: 2.8 g/dL — AB (ref 3.5–5.0)
ALT: 13 U/L — ABNORMAL LOW (ref 14–54)
ALT: 12 U/L — ABNORMAL LOW (ref 14–54)
ANION GAP: 6 (ref 5–15)
ANION GAP: 7 (ref 5–15)
AST: 11 U/L — ABNORMAL LOW (ref 15–41)
AST: 11 U/L — ABNORMAL LOW (ref 15–41)
Albumin: 2.9 g/dL — ABNORMAL LOW (ref 3.5–5.0)
Alkaline Phosphatase: 112 U/L (ref 38–126)
Alkaline Phosphatase: 107 U/L (ref 38–126)
BUN: <5 mg/dL — ABNORMAL LOW (ref 6–20)
BUN: 8 mg/dL (ref 6–20)
CHLORIDE: 102 mmol/L (ref 101–111)
CHLORIDE: 103 mmol/L (ref 101–111)
CO2: 24 mmol/L (ref 22–32)
CO2: 25 mmol/L (ref 22–32)
CREATININE: 0.46 mg/dL (ref 0.44–1.00)
Calcium: 13.2 mg/dL (ref 8.9–10.3)
Calcium: 11.6 mg/dL — ABNORMAL HIGH (ref 8.9–10.3)
Creatinine, Ser: 0.56 mg/dL (ref 0.44–1.00)
GFR calc Af Amer: >60 mL/min (ref >60)
GFR calc non Af Amer: >60 mL/min (ref >60)
GLUCOSE: 89 mg/dL (ref 65–99)
Glucose, Bld: 109 mg/dL — ABNORMAL HIGH (ref 65–99)
POTASSIUM: 3.8 mmol/L (ref 3.5–5.1)
POTASSIUM: 3.9 mmol/L (ref 3.5–5.1)
SODIUM: 133 mmol/L — AB (ref 135–145)
Sodium: 134 mmol/L — ABNORMAL LOW (ref 135–145)
Total Bilirubin: 0.3 mg/dL (ref 0.3–1.2)
Total Bilirubin: 0.5 mg/dL (ref 0.3–1.2)
Total Protein: 6.7 g/dL (ref 6.5–8.1)
Total Protein: 6.2 g/dL — ABNORMAL LOW (ref 6.5–8.1)

## 2016-05-19 LAB — PROTEIN / CREATININE RATIO, URINE
CREATININE, URINE: 104 mg/dL
Creatinine, Urine: 87 mg/dL
PROTEIN CREATININE RATIO: 0.09 mg/mg{creat} (ref 0.00–0.15)
PROTEIN CREATININE RATIO: 0.14 mg/mg{creat} (ref 0.00–0.15)
TOTAL PROTEIN, URINE: 15 mg/dL
TOTAL PROTEIN, URINE: 8 mg/dL

## 2016-05-19 MED ORDER — ONDANSETRON 4 MG PO TBDP
4.0000 mg | ORAL_TABLET | Freq: Once | ORAL | Status: AC
  Administered 2016-05-19: 4 mg via ORAL
  Filled 2016-05-19: qty 1

## 2016-05-19 MED ORDER — DEXAMETHASONE SODIUM PHOSPHATE 10 MG/ML IJ SOLN
10.0000 mg | Freq: Once | INTRAMUSCULAR | Status: AC
  Administered 2016-05-19: 10 mg via INTRAVENOUS
  Filled 2016-05-19: qty 1

## 2016-05-19 MED ORDER — SODIUM CHLORIDE 0.9 % IV BOLUS (SEPSIS)
1000.0000 mL | Freq: Once | INTRAVENOUS | Status: AC
  Administered 2016-05-19: 1000 mL via INTRAVENOUS

## 2016-05-19 MED ORDER — METOCLOPRAMIDE HCL 5 MG/ML IJ SOLN
10.0000 mg | Freq: Once | INTRAMUSCULAR | Status: AC
  Administered 2016-05-19: 10 mg via INTRAVENOUS
  Filled 2016-05-19: qty 2

## 2016-05-19 MED ORDER — ASPIRIN EC 81 MG PO TBEC: 81.0000 mg | DELAYED_RELEASE_TABLET | Freq: Every day | ORAL | Status: DC

## 2016-05-19 MED ORDER — OXYCODONE-ACETAMINOPHEN 5-325 MG PO TABS
2.0000 | ORAL_TABLET | Freq: Once | ORAL | Status: AC
  Administered 2016-05-19: 2 via ORAL
  Filled 2016-05-19: qty 2

## 2016-05-19 MED ORDER — DIPHENHYDRAMINE HCL 50 MG/ML IJ SOLN
25.0000 mg | Freq: Once | INTRAMUSCULAR | Status: AC
  Administered 2016-05-19: 25 mg via INTRAVENOUS
  Filled 2016-05-19: qty 1

## 2016-05-19 MED ORDER — BUTALBITAL-APAP-CAFFEINE 50-325-40 MG PO TABS: 1.0000 | ORAL_TABLET | Freq: Four times a day (QID) | ORAL | Status: DC | PRN

## 2016-05-19 NOTE — MAU Provider Note (Signed)
History     CSN: [id number]  Arrival date and time: 05/18/16 2332   None     Chief Complaint  Patient presents with  . Headache   HPI [name] [name] is a 25 y.o. female [phone] @ 31.1wks by 21wk scan presenting for eval of H/A and blurred vision x 3d, vomiting x 2 today. Having some abd cramping, but denies leaking or bldg. No RUQ pain.  Also w/ bilat wrist/hand pain (dx w/ carpal tunnel at MAU visit 3 wks ago but has not purchased wrist splints). She has not started [facility] but has a new OB visit scheduled for 05/25/16 at the [facility]. Her OB hx is remarkable for C/S x 3, gHTN w/ 1st preg only necessitating IOL @ 36 1/2 wks. Denies any other complications with prev pregnancies.  OB History    Gravida Para Term Preterm AB TAB SAB Ectopic Multiple Living   6 3 3  2  2   3       No past medical history on file.  Past Surgical History  Procedure Laterality Date  . Cesarean section      No family history on file.  Social History  Substance Use Topics  . Smoking status: Never Smoker   . Smokeless tobacco: Never Used  . Alcohol Use: No    Allergies:  Allergies  Allergen Reactions  . Morphine And Related Nausea And Vomiting    Prescriptions prior to admission  Medication Sig Dispense Refill Last Dose  . Prenatal Vit-Fe Fumarate-FA (MULTIVITAMIN-PRENATAL) 27-0.8 MG TABS tablet Take 1 tablet by mouth daily at 12 noon.   05/18/2016 at Unknown time  . benzonatate (TESSALON) 100 MG capsule Take 1 capsule (100 mg total) by mouth 3 (three) times daily as needed for cough. 20 capsule 0   . dextromethorphan-guaiFENesin (MUCINEX DM) 30-600 MG 12hr tablet Take 1 tablet by mouth 2 (two) times daily as needed for cough. 20 tablet 0   . potassium chloride (K-DUR) 10 MEQ tablet Take 4 tablets (40 mEq total) by mouth daily. 12 tablet 0     ROS No pertinents other than what is listed in HPI Physical Exam   BPs: 141/93, 142/98, 133/87, 132/99 Blood pressure 154/104, pulse 103,  temperature 98.1 F (36.7 C), temperature source Oral, resp. rate 20, height 5\' 2"  (1.575 m), weight 93.328 kg (205 lb 12 oz), last menstrual period 11/10/2015.  Physical Exam  Constitutional: She is oriented to person, place, and time. She appears well-developed.  HENT:  Head: Normocephalic.  Neck: Normal range of motion.  Cardiovascular: Normal rate.   Respiratory: Effort normal.  GI:  EFM 130s, +accels, no decels, intermittent tracing due to pt movement No ctx per toco  Genitourinary: Vagina normal.  Cx C/L  Musculoskeletal: Normal range of motion.  BLE 1+ edema  Neurological: She is alert and oriented to person, place, and time.  Skin: Skin is warm and dry.  Psychiatric: She has a normal mood and affect. Her behavior is normal. Thought content normal.   CBC    Component Value Date/Time   WBC 16.4* 05/19/2016 0050   RBC 4.16 05/19/2016 0050   HGB 13.2 05/19/2016 0050   HCT 37.1 05/19/2016 0050   PLT 362 05/19/2016 0050   MCV 89.2 05/19/2016 0050   MCH 31.7 05/19/2016 0050   MCHC 35.6 05/19/2016 0050   RDW 14.0 05/19/2016 0050   CMP     Component Value Date/Time   NA 134* 05/19/2016 0050   K 3.8  05/19/2016 0050   CL 103 05/19/2016 0050   CO2 24 05/19/2016 0050   GLUCOSE 109* 05/19/2016 0050   BUN <5* 05/19/2016 0050   CREATININE 0.46 05/19/2016 0050   CREATININE 0.51 03/29/2008 1310   CALCIUM 13.2* 05/19/2016 0050   PROT 6.7 05/19/2016 0050   ALBUMIN 2.9* 05/19/2016 0050   AST 11* 05/19/2016 0050   ALT 13* 05/19/2016 0050   ALKPHOS 112 05/19/2016 0050   BILITOT 0.3 05/19/2016 0050   GFRNONAA >60 05/19/2016 0050   GFRAA >60 05/19/2016 0050   Urine P/C: 0.14  MAU Course  Procedures  MDM UA/CBC/CMET/U PC ratio ordered Given Percocet #2: states H/A and visual disturbances relieved  Assessment and Plan  IUP@31 .1wks Elevated BP in preg w/ hx gHTN  D/C home with pre-e precautions Rx Fioricet #20 Rec wrist splints Keep New OB appt on 7/12, or return  sooner if H/A not relieved by [place], [place] 05/19/2016, 1:58 AM

## 2016-05-19 NOTE — Discharge Instructions (Signed)
Hypertension During Pregnancy Hypertension is also called high blood pressure. Blood pressure moves blood in your body. Sometimes, the force that moves the blood becomes too strong. When you are pregnant, this condition should be watched carefully. It can cause problems for you and your baby. HOME CARE   Make and keep all of your doctor visits.  Take medicine as told by your doctor. Tell your doctor about all medicines you take.  Eat very little salt.  Exercise regularly.  Do not drink alcohol.  Do not smoke.  Do not have drinks with caffeine.  Lie on your left side when resting.  Your health care provider may ask you to take one low-dose aspirin (81mg ) each day. GET HELP RIGHT AWAY IF:  You have bad belly (abdominal) pain.  You have sudden puffiness (swelling) in the hands, ankles, or face.  You gain 4 pounds (1.8 kilograms) or more in 1 week.  You throw up (vomit) repeatedly.  You have bleeding from the vagina.  You do not feel the baby moving as much.  You have a headache.  You have blurred or double vision.  You have muscle twitching or spasms.  You have shortness of breath.  You have blue fingernails and lips.  You have blood in your pee (urine). MAKE SURE YOU:  Understand these instructions.  Will watch your condition.  Will get help right away if you are not doing well or get worse.   This information is not intended to replace advice given to you by your health care provider. Make sure you discuss any questions you have with your health care provider.   Document Released: 12/03/2010 Document Revised: 11/21/2014 Document Reviewed: 05/30/2013 Elsevier Interactive Patient Education 2016 [employer].  Migraine Headache A migraine headache is very bad, throbbing pain on one or both sides of your head. Talk to your doctor about what things may bring on (trigger) your migraine headaches. HOME CARE  Only take medicines as told by your doctor.  Lie  down in a dark, quiet room when you have a migraine.  Keep a journal to find out if certain things bring on migraine headaches. For example, write down:  What you eat and drink.  How much sleep you get.  Any change to your diet or medicines.  Lessen how much alcohol you drink.  Quit smoking if you smoke.  Get enough sleep.  Lessen any stress in your life.  Keep lights dim if bright lights bother you or make your migraines worse. GET HELP RIGHT AWAY IF:   Your migraine becomes really bad.  You have a fever.  You have a stiff neck.  You have trouble seeing.  Your muscles are weak, or you lose muscle control.  You lose your balance or have trouble walking.  You feel like you will pass out (faint), or you pass out.  You have really bad symptoms that are different than your first symptoms. MAKE SURE YOU:   Understand these instructions.  Will watch your condition.  Will get help right away if you are not doing well or get worse.   This information is not intended to replace advice given to you by your health care provider. Make sure you discuss any questions you have with your health care provider.   Document Released: 08/09/2008 Document Revised: 01/23/2012 Document Reviewed: 07/08/2013 Elsevier Interactive Patient Education [employer].

## 2016-05-19 NOTE — MAU Provider Note (Signed)
Care assumed from linewebber  26 [id number] @ 31+1. Here last night for ha, returned this evening for the same. eval by my predecessor. 1 L NS, benadryl, reglan, and decadron ordered. HA now resolved. BP elevated last night to 150s. Here systolics all normal, couple of mildly elevated diastolics. preE labs wnl last night and again this evening. Hx ghtn vs preE last pregnancy (patient unsure which). I think she now has gestational hypertension. Mild, no bp meds indicated. Has initial ob visit next week. Will order anatomy scan. Will need to start antenatal testing at that time. Start aspirin, though at this gestational age benefit likely small to none. PreE/hellp return precautions discussed.

## 2016-05-19 NOTE — Discharge Instructions (Signed)

## 2016-05-19 NOTE — MAU Provider Note (Signed)
History     CSN: [id number]  Arrival date and time: 05/19/16 1923   None     No chief complaint on file.  HPI  Pt is [redacted]w[redacted]d [id number] who presents with headache, nausea, vomiting, diarrhea and abdominal pain. Pt was seen last night for her headache and given some Percocet #2 that helped and then wore off.  Pt given prescription for fioricet and pt states it didn't work any better than tylenol. Pt's headache is all over- but mostly frontal.  Pt has photophobia. Pt has hx of Migraines. Pt also has had diarrhea all day today. Pt also c/o of bilateral carpal tunnel- pt states pain medicine last night helped  Pt is late to care with  NOB appointment in [place] on 05/25/2016 Pt has hx of C/S x 2 and IOL at 36 1/2 weeks for preeclampsia.  RN note:      Expand All Collapse All   Pt reports she has had a headache for 4 days, blurred vision. Vomiting and diarrhea all day. Denies fever. Was evaluated here last pm       History reviewed. No pertinent past medical history.  Past Surgical History  Procedure Laterality Date  . Cesarean section      History reviewed. No pertinent family history.  Social History  Substance Use Topics  . Smoking status: Never Smoker   . Smokeless tobacco: Never Used  . Alcohol Use: No    Allergies:  Allergies  Allergen Reactions  . Morphine And Related Itching and Nausea And Vomiting    Prescriptions prior to admission  Medication Sig Dispense Refill Last Dose  . acetaminophen (TYLENOL) 500 MG tablet Take 1,000 mg by mouth every 6 (six) hours as needed for mild pain.   05/18/2016 at Unknown time  . butalbital-acetaminophen-caffeine (FIORICET) 50-325-40 MG tablet Take 1-2 tablets by mouth every 6 (six) hours as needed for headache. 20 tablet 0 05/19/2016 at Unknown time  . calcium carbonate (TUMS - DOSED IN MG ELEMENTAL CALCIUM) 500 MG chewable tablet Chew 2 tablets by mouth as needed for indigestion or heartburn.   05/19/2016 at Unknown time  . Prenatal  Vit-Fe Fumarate-FA (MULTIVITAMIN-PRENATAL) 27-0.8 MG TABS tablet Take 1 tablet by mouth daily at 12 noon.   05/18/2016 at Unknown time    Review of Systems  Constitutional: Positive for malaise/fatigue. Negative for fever and chills.  Eyes: Positive for photophobia. Negative for double vision.  Gastrointestinal: Positive for nausea, vomiting, abdominal pain and diarrhea.  Genitourinary: Negative for dysuria.  Neurological: Positive for headaches.   Physical Exam   Blood pressure 123/98, pulse 131, temperature 98.3 F (36.8 C), resp. rate 18, height 5\' 2"  (1.575 m), weight 204 lb (92.534 kg), last menstrual period 11/10/2015, SpO2 99 %.  Physical Exam  Vitals reviewed. Constitutional: She is oriented to person, place, and time. She appears well-developed and well-nourished. No distress.  HENT:  Head: Normocephalic.  Eyes: Pupils are equal, round, and reactive to light.  Neck: Normal range of motion. Neck supple.  Cardiovascular: Normal rate.   Respiratory: Effort normal.  GI: Soft. She exhibits no distension. There is no tenderness. There is no rebound.  Musculoskeletal: Normal range of motion.  Neurological: She is alert and oriented to person, place, and time.  Skin: Skin is warm and dry.  Psychiatric: She has a normal mood and affect.    MAU Course  Procedures NS IVF bendaryl, reglan, and decadron BP elevated with elevated to 154/104- PIH labs drawn  Care turned over  to Dr. Si Raider Pt wanting percocet  Assessment and Plan    [name] [name] 05/19/2016, 8:01 PM

## 2016-05-19 NOTE — MAU Note (Signed)
Pt reports she has had a headache for 4 days, blurred vision. Vomiting and diarrhea all day. Denies fever. Was evaluated here last pm.

## 2016-05-25 ENCOUNTER — Inpatient Hospital Stay (HOSPITAL_COMMUNITY)
Admission: AD | Admit: 2016-05-25 | Discharge: 2016-05-25 | Disposition: A | Discharge status: Home / Self Care | Payer: Medicaid Other | Source: Ambulatory Visit | Attending: Obstetrics & Gynecology | Admitting: Obstetrics & Gynecology

## 2016-05-25 ENCOUNTER — Telehealth: Payer: Self-pay | Admitting: General Practice

## 2016-05-25 ENCOUNTER — Ambulatory Visit (INDEPENDENT_AMBULATORY_CARE_PROVIDER_SITE_OTHER): Payer: Medicaid Other | Admitting: Advanced Practice Midwife

## 2016-05-25 ENCOUNTER — Encounter: Payer: Self-pay | Admitting: Advanced Practice Midwife

## 2016-05-25 ENCOUNTER — Ambulatory Visit (INDEPENDENT_AMBULATORY_CARE_PROVIDER_SITE_OTHER): Payer: Medicaid Other | Admitting: Clinical

## 2016-05-25 ENCOUNTER — Encounter (HOSPITAL_COMMUNITY): Payer: Self-pay

## 2016-05-25 ENCOUNTER — Other Ambulatory Visit (HOSPITAL_COMMUNITY)
Admission: RE | Admit: 2016-05-25 | Discharge: 2016-05-25 | Disposition: A | Discharge status: Home / Self Care | Payer: Medicaid Other | Source: Ambulatory Visit | Attending: Advanced Practice Midwife | Admitting: Advanced Practice Midwife

## 2016-05-25 VITALS — BP 142/98 | HR 97 | Ht 63.5 in | Wt 146.0 lb

## 2016-05-25 DIAGNOSIS — F329 Major depressive disorder, single episode, unspecified: Secondary | ICD-10-CM | POA: Diagnosis not present

## 2016-05-25 DIAGNOSIS — Z113 Encounter for screening for infections with a predominantly sexual mode of transmission: Secondary | ICD-10-CM | POA: Diagnosis not present

## 2016-05-25 DIAGNOSIS — O26899 Other specified pregnancy related conditions, unspecified trimester: Secondary | ICD-10-CM

## 2016-05-25 DIAGNOSIS — O99333 Smoking (tobacco) complicating pregnancy, third trimester: Secondary | ICD-10-CM | POA: Diagnosis not present

## 2016-05-25 DIAGNOSIS — G5600 Carpal tunnel syndrome, unspecified upper limb: Secondary | ICD-10-CM | POA: Diagnosis not present

## 2016-05-25 DIAGNOSIS — F322 Major depressive disorder, single episode, severe without psychotic features: Secondary | ICD-10-CM | POA: Diagnosis not present

## 2016-05-25 DIAGNOSIS — F419 Anxiety disorder, unspecified: Secondary | ICD-10-CM | POA: Insufficient documentation

## 2016-05-25 DIAGNOSIS — Z3A32 32 weeks gestation of pregnancy: Secondary | ICD-10-CM | POA: Diagnosis not present

## 2016-05-25 DIAGNOSIS — Z8669 Personal history of other diseases of the nervous system and sense organs: Secondary | ICD-10-CM

## 2016-05-25 DIAGNOSIS — O139 Gestational [pregnancy-induced] hypertension without significant proteinuria, unspecified trimester: Secondary | ICD-10-CM

## 2016-05-25 DIAGNOSIS — D126 Benign neoplasm of colon, unspecified: Secondary | ICD-10-CM | POA: Insufficient documentation

## 2016-05-25 DIAGNOSIS — O163 Unspecified maternal hypertension, third trimester: Secondary | ICD-10-CM | POA: Diagnosis not present

## 2016-05-25 DIAGNOSIS — O26893 Other specified pregnancy related conditions, third trimester: Secondary | ICD-10-CM | POA: Diagnosis not present

## 2016-05-25 DIAGNOSIS — O34219 Maternal care for unspecified type scar from previous cesarean delivery: Secondary | ICD-10-CM

## 2016-05-25 DIAGNOSIS — T7491XA Unspecified adult maltreatment, confirmed, initial encounter: Secondary | ICD-10-CM

## 2016-05-25 DIAGNOSIS — Z23 Encounter for immunization: Secondary | ICD-10-CM

## 2016-05-25 DIAGNOSIS — F32A Depression, unspecified: Secondary | ICD-10-CM | POA: Insufficient documentation

## 2016-05-25 DIAGNOSIS — O133 Gestational [pregnancy-induced] hypertension without significant proteinuria, third trimester: Secondary | ICD-10-CM

## 2016-05-25 DIAGNOSIS — O99343 Other mental disorders complicating pregnancy, third trimester: Secondary | ICD-10-CM | POA: Diagnosis not present

## 2016-05-25 DIAGNOSIS — I10 Essential (primary) hypertension: Secondary | ICD-10-CM | POA: Diagnosis present

## 2016-05-25 DIAGNOSIS — O0992 Supervision of high risk pregnancy, unspecified, second trimester: Secondary | ICD-10-CM

## 2016-05-25 LAB — COMPREHENSIVE METABOLIC PANEL
ALT: 12 U/L — ABNORMAL LOW (ref 14–54)
ANION GAP: 7 (ref 5–15)
AST: 9 U/L — AB (ref 15–41)
Albumin: 2.9 g/dL — ABNORMAL LOW (ref 3.5–5.0)
Alkaline Phosphatase: 106 U/L (ref 38–126)
BILIRUBIN TOTAL: 0.4 mg/dL (ref 0.3–1.2)
BUN: 6 mg/dL (ref 6–20)
CHLORIDE: 104 mmol/L (ref 101–111)
CO2: 25 mmol/L (ref 22–32)
Calcium: 9.9 mg/dL (ref 8.9–10.3)
Creatinine, Ser: 0.48 mg/dL (ref 0.44–1.00)
Glucose, Bld: 75 mg/dL (ref 65–99)
POTASSIUM: 3.7 mmol/L (ref 3.5–5.1)
Sodium: 136 mmol/L (ref 135–145)
TOTAL PROTEIN: 6.5 g/dL (ref 6.5–8.1)

## 2016-05-25 LAB — POCT URINALYSIS DIP (DEVICE)
Bilirubin Urine: NEGATIVE
GLUCOSE, UA: NEGATIVE mg/dL
KETONES UR: NEGATIVE mg/dL
Nitrite: NEGATIVE
Protein, ur: 30 mg/dL — AB
SPECIFIC GRAVITY, URINE: 1.015 (ref 1.005–1.030)
Urobilinogen, UA: 0.2 mg/dL (ref 0.0–1.0)
pH: 7.5 (ref 5.0–8.0)

## 2016-05-25 LAB — CBC
HEMATOCRIT: 35 % — AB (ref 36.0–46.0)
Hemoglobin: 12.2 g/dL (ref 12.0–15.0)
MCH: 30.8 pg (ref 26.0–34.0)
MCHC: 34.9 g/dL (ref 30.0–36.0)
MCV: 88.4 fL (ref 78.0–100.0)
PLATELETS: 352 10*3/uL (ref 150–400)
RBC: 3.96 MIL/uL (ref 3.87–5.11)
RDW: 13.8 % (ref 11.5–15.5)
WBC: 13.7 10*3/uL — AB (ref 4.0–10.5)

## 2016-05-25 LAB — PROTEIN / CREATININE RATIO, URINE
CREATININE, URINE: 102 mg/dL
Protein Creatinine Ratio: 0.13 mg/mg{Cre} (ref 0.00–0.15)
Total Protein, Urine: 13 mg/dL

## 2016-05-25 MED ORDER — OXYCODONE-ACETAMINOPHEN 5-325 MG PO TABS
2.0000 | ORAL_TABLET | Freq: Once | ORAL | Status: AC
  Administered 2016-05-25: 2 via ORAL
  Filled 2016-05-25: qty 2

## 2016-05-25 MED ORDER — TETANUS-DIPHTH-ACELL PERTUSSIS 5-2.5-18.5 LF-MCG/0.5 IM SUSP
0.5000 mL | Freq: Once | INTRAMUSCULAR | Status: AC
  Administered 2016-05-25: 0.5 mL via INTRAMUSCULAR

## 2016-05-25 MED ORDER — PROMETHAZINE HCL 25 MG PO TABS
25.0000 mg | ORAL_TABLET | Freq: Once | ORAL | Status: DC
  Filled 2016-05-25: qty 1

## 2016-05-25 MED ORDER — METOCLOPRAMIDE HCL 10 MG PO TABS: 10.0000 mg | ORAL_TABLET | Freq: Four times a day (QID) | ORAL | Status: DC | PRN

## 2016-05-25 MED ORDER — OXYCODONE-ACETAMINOPHEN 5-325 MG PO TABS: 1.0000 | ORAL_TABLET | Freq: Four times a day (QID) | ORAL | Status: DC | PRN

## 2016-05-25 MED ORDER — METOCLOPRAMIDE HCL 10 MG PO TABS
10.0000 mg | ORAL_TABLET | Freq: Once | ORAL | Status: AC
  Administered 2016-05-25: 10 mg via ORAL
  Filled 2016-05-25: qty 1

## 2016-05-25 NOTE — Patient Instructions (Signed)
Hypertension During Pregnancy Hypertension, or high blood pressure, is when there is extra pressure inside your blood vessels that carry blood from the heart to the rest of your body (arteries). It can happen at any time in life, including pregnancy. Hypertension during pregnancy can cause problems for you and your baby. Your baby might not weigh as much as he or she should at birth or might be born early (premature). Very bad cases of hypertension during pregnancy can be life-threatening.  Different types of hypertension can occur during pregnancy. These include:  Chronic hypertension. This happens when a woman has hypertension before pregnancy and it continues during pregnancy.  Gestational hypertension. This is when hypertension develops during pregnancy.  Preeclampsia or toxemia of pregnancy. This is a very serious type of hypertension that develops only during pregnancy. It affects the whole body and can be very dangerous for both mother and baby.  Gestational hypertension and preeclampsia usually go away after your baby is born. Your blood pressure will likely stabilize within 6 weeks. Women who have hypertension during pregnancy have a greater chance of developing hypertension later in life or with future pregnancies. RISK FACTORS There are certain factors that make it more likely for you to develop hypertension during pregnancy. These include:  Having hypertension before pregnancy.  Having hypertension during a previous pregnancy.  Being overweight.  Being older than 40 years.  Being pregnant with more than one baby.  Having diabetes or kidney problems. SIGNS AND SYMPTOMS Chronic and gestational hypertension rarely cause symptoms. Preeclampsia has symptoms, which may include:  Increased protein in your urine. Your health care provider will check for this at every prenatal visit.  Swelling of your hands and face.  Rapid weight gain.  Headaches.  Visual changes.  Being  bothered by light.  Abdominal pain, especially in the upper right area.  Chest pain.  Shortness of breath.  Increased reflexes.  Seizures. These occur with a more severe form of preeclampsia, called eclampsia. DIAGNOSIS  You may be diagnosed with hypertension during a regular prenatal exam. At each prenatal visit, you may have:  Your blood pressure checked.  A urine test to check for protein in your urine. The type of hypertension you are diagnosed with depends on when you developed it. It also depends on your specific blood pressure reading.  Developing hypertension before 20 weeks of pregnancy is consistent with chronic hypertension.  Developing hypertension after 20 weeks of pregnancy is consistent with gestational hypertension.  Hypertension with increased urinary protein is diagnosed as preeclampsia.  Blood pressure measurements that stay above 160 systolic or 110 diastolic are a sign of severe preeclampsia. TREATMENT Treatment for hypertension during pregnancy varies. Treatment depends on the type of hypertension and how serious it is.  If you take medicine for chronic hypertension, you may need to switch medicines.  Medicines called ACE inhibitors should not be taken during pregnancy.  Low-dose aspirin may be suggested for women who have risk factors for preeclampsia.  If you have gestational hypertension, you may need to take a blood pressure medicine that is safe during pregnancy. Your health care provider will recommend the correct medicine.  If you have severe preeclampsia, you may need to be in the hospital. Health care providers will watch you and your baby very closely. You also may need to take medicine called magnesium sulfate to prevent seizures and lower blood pressure.  Sometimes, an early delivery is needed. This may be the case if the condition worsens. It would be   done to protect you and your baby. The only cure for preeclampsia is delivery.  Your health  care provider may recommend that you take one low-dose aspirin (81 mg) each day to help prevent high blood pressure during your pregnancy if you are at risk for preeclampsia. You may be at risk for preeclampsia if:  You had preeclampsia or eclampsia during a previous pregnancy.  Your baby did not grow as expected during a previous pregnancy.  You experienced preterm birth with a previous pregnancy.  You experienced a separation of the placenta from the uterus (placental abruption) during a previous pregnancy.  You experienced the loss of your baby during a previous pregnancy.  You are pregnant with more than one baby.  You have other medical conditions, such as diabetes or an autoimmune disease. HOME CARE INSTRUCTIONS  Schedule and keep all of your regular prenatal care appointments. This is important.  Take medicines only as directed by your health care provider. Tell your health care provider about all medicines you take.  Eat as little salt as possible.  Get regular exercise.  Do not drink alcohol.  Do not use tobacco products.  Do not drink products with caffeine.  Lie on your left side when resting. SEEK IMMEDIATE MEDICAL CARE IF:  You have severe abdominal pain.  You have sudden swelling in your hands, ankles, or face.  You gain 4 pounds (1.8 kg) or more in 1 week.  You vomit repeatedly.  You have vaginal bleeding.  You do not feel your baby moving as much.  You have a headache.  You have blurred or double vision.  You have muscle twitching or spasms.  You have shortness of breath.  You have blue fingernails or lips.  You have blood in your urine. MAKE SURE YOU:  Understand these instructions.  Will watch your condition.  Will get help right away if you are not doing well or get worse.   This information is not intended to replace advice given to you by your health care provider. Make sure you discuss any questions you have with your health care  provider.   Document Released: 07/19/2011 Document Revised: 11/21/2014 Document Reviewed: 05/30/2013 Elsevier Interactive Patient Education 2016 [place] of Pregnancy The third trimester is from week 29 through week 42, months 7 through 9. The third trimester is a time when the fetus is growing rapidly. At the end of the ninth month, the fetus is about 20 inches in length and weighs 6-10 pounds.  BODY CHANGES Your body goes through many changes during pregnancy. The changes vary from woman to woman.   Your weight will continue to increase. You can expect to gain 25-35 pounds (11-16 kg) by the end of the pregnancy.  You may begin to get stretch marks on your hips, abdomen, and breasts.  You may urinate more often because the fetus is moving lower into your pelvis and pressing on your bladder.  You may develop or continue to have heartburn as a result of your pregnancy.  You may develop constipation because certain hormones are causing the muscles that push waste through your intestines to slow down.  You may develop hemorrhoids or swollen, bulging veins (varicose veins).  You may have pelvic pain because of the weight gain and pregnancy hormones relaxing your joints between the bones in your pelvis. Backaches may result from overexertion of the muscles supporting your posture.  You may have changes in your hair. These can include thickening of  your hair, rapid growth, and changes in texture. Some women also have hair loss during or after pregnancy, or hair that feels dry or thin. Your hair will most likely return to normal after your baby is born. °· Your breasts will continue to grow and be tender. A yellow discharge may leak from your breasts called colostrum. °· Your belly button may stick out. °· You may feel short of breath because of your expanding uterus. °· You may notice the fetus "dropping," or moving lower in your abdomen. °· You may have a bloody mucus  discharge. This usually occurs a few days to a week before labor begins. °· Your cervix becomes thin and soft (effaced) near your due date. °WHAT TO EXPECT AT YOUR PRENATAL EXAMS  °You will have prenatal exams every 2 weeks until week 36. Then, you will have weekly prenatal exams. During a routine prenatal visit: °· You will be weighed to make sure you and the fetus are growing normally. °· Your blood pressure is taken. °· Your abdomen will be measured to track your baby's growth. °· The fetal heartbeat will be listened to. °· Any test results from the previous visit will be discussed. °· You may have a cervical check near your due date to see if you have effaced. °At around 36 weeks, your caregiver will check your cervix. At the same time, your caregiver will also perform a test on the secretions of the vaginal tissue. This test is to determine if a type of bacteria, Group B streptococcus, is present. Your caregiver will explain this further. °Your caregiver may ask you: °· What your birth plan is. °· How you are feeling. °· If you are feeling the baby move. °· If you have had any abnormal symptoms, such as leaking fluid, bleeding, severe headaches, or abdominal cramping. °· If you are using any tobacco products, including cigarettes, chewing tobacco, and electronic cigarettes. °· If you have any questions. °Other tests or screenings that may be performed during your third trimester include: °· Blood tests that check for low iron levels (anemia). °· Fetal testing to check the health, activity level, and growth of the fetus. Testing is done if you have certain medical conditions or if there are problems during the pregnancy. °· HIV (human immunodeficiency virus) testing. If you are at high risk, you may be screened for HIV during your third trimester of pregnancy. °FALSE LABOR °You may feel small, irregular contractions that eventually go away. These are called Braxton Hicks contractions, or false labor.  Contractions may last for hours, days, or even weeks before true labor sets in. If contractions come at regular intervals, intensify, or become painful, it is best to be seen by your caregiver.  °SIGNS OF LABOR  °· Menstrual-like cramps. °· Contractions that are 5 minutes apart or less. °· Contractions that start on the top of the uterus and spread down to the lower abdomen and back. °· A sense of increased pelvic pressure or back pain. °· A watery or bloody mucus discharge that comes from the vagina. °If you have any of these signs before the 37th week of pregnancy, call your caregiver right away. You need to go to the hospital to get checked immediately. °HOME CARE INSTRUCTIONS  °· Avoid all smoking, herbs, alcohol, and unprescribed drugs. These chemicals affect the formation and growth of the baby. °· Do not use any tobacco products, including cigarettes, chewing tobacco, and electronic cigarettes. If you need help quitting, ask your health care   provider. You may receive counseling support and other resources to help you quit.  Follow your caregiver's instructions regarding medicine use. There are medicines that are either safe or unsafe to take during pregnancy.  Exercise only as directed by your caregiver. Experiencing uterine cramps is a good sign to stop exercising.  Continue to eat regular, healthy meals.  Wear a good support bra for breast tenderness.  Do not use hot tubs, steam rooms, or saunas.  Wear your seat belt at all times when driving.  Avoid raw meat, uncooked cheese, cat litter boxes, and soil used by cats. These carry germs that can cause birth defects in the baby.  Take your prenatal vitamins.  Take 1500-2000 mg of calcium daily starting at the 20th week of pregnancy until you deliver your baby.  Try taking a stool softener (if your caregiver approves) if you develop constipation. Eat more high-fiber foods, such as fresh vegetables or fruit and whole grains. Drink plenty of  fluids to keep your urine clear or pale yellow.  Take warm sitz baths to soothe any pain or discomfort caused by hemorrhoids. Use hemorrhoid cream if your caregiver approves.  If you develop varicose veins, wear support hose. Elevate your feet for 15 minutes, 3-4 times a day. Limit salt in your diet.  Avoid heavy lifting, wear low heal shoes, and practice good posture.  Rest a lot with your legs elevated if you have leg cramps or low back pain.  Visit your dentist if you have not gone during your pregnancy. Use a soft toothbrush to brush your teeth and be gentle when you floss.  A sexual relationship may be continued unless your caregiver directs you otherwise.  Do not travel far distances unless it is absolutely necessary and only with the approval of your caregiver.  Take prenatal classes to understand, practice, and ask questions about the labor and delivery.  Make a trial run to the hospital.  Pack your hospital bag.  Prepare the baby's nursery.  Continue to go to all your prenatal visits as directed by your caregiver. SEEK MEDICAL CARE IF:  You are unsure if you are in labor or if your water has broken.  You have dizziness.  You have mild pelvic cramps, pelvic pressure, or nagging pain in your abdominal area.  You have persistent nausea, vomiting, or diarrhea.  You have a bad smelling vaginal discharge.  You have pain with urination. SEEK IMMEDIATE MEDICAL CARE IF:   You have a fever.  You are leaking fluid from your vagina.  You have spotting or bleeding from your vagina.  You have severe abdominal cramping or pain.  You have rapid weight loss or gain.  You have shortness of breath with chest pain.  You notice sudden or extreme swelling of your face, hands, ankles, feet, or legs.  You have not felt your baby move in over an hour.  You have severe headaches that do not go away with medicine.  You have vision changes.   This information is not intended  to replace advice given to you by your health care provider. Make sure you discuss any questions you have with your health care provider.   Document Released: 10/25/2001 Document Revised: 11/21/2014 Document Reviewed: 01/01/2013 Elsevier Interactive Patient Education [employer].

## 2016-05-25 NOTE — Progress Notes (Signed)
New OB see Smart Set  Subjective:    [name] [name] is a [id number] [redacted]w[redacted]d being seen today for her first obstetrical visit.  Her obstetrical history is significant for pregnancy induced hypertension and prior C/S x 3, first pregnancy had early onset gestational hypertension. Patient does intend to breast feed. Pregnancy history fully reviewed.  Patient reports headache and visual changes, edema, severe carpal tunnel pain.  Also has high depression score.  Filed Vitals:   05/25/16 1003 05/25/16 1004  BP: 142/98   Pulse: 97   Height:  5' 3.5" (1.613 m)  Weight: 146 lb (66.225 kg)     HISTORY: OB History  Gravida Para Term Preterm AB SAB TAB Ectopic Multiple Living  6 3 3  2 2    3     # Outcome Date GA Lbr Len/2nd Weight Sex Delivery Anes PTL Lv  6 Current           5 Term 08/22/12 [redacted]w[redacted]d   F CS-LTranv  N Y  4 Term 08/15/11 [redacted]w[redacted]d   Berenice Bouton   Y  3 Term 04/10/08 [redacted]w[redacted]d   M CS-LTranv   Y  2 SAB           1 SAB              Past Medical History  Diagnosis Date  . Depression   . Pregnancy induced hypertension   . Anxiety    Past Surgical History  Procedure Laterality Date  . Cesarean section     Family History  Problem Relation Age of Onset  . Mental illness Father      Exam    Uterus:  Fundal Height: 32 cm  Pelvic Exam:    Perineum: Normal Perineum   Vulva: Bartholin's, Urethra, Skene's normal   Vagina:  normal discharge   pH:    Cervix: no lesions and soft   Adnexa: no mass, fullness, tenderness   Bony Pelvis: gynecoid  System: Breast:  normal appearance, no masses or tenderness   Skin: normal coloration and turgor, no rashes    Neurologic: oriented, grossly non-focal   Extremities: normal strength, tone, and muscle mass   HEENT neck supple with midline trachea   Mouth/Teeth mucous membranes moist, pharynx normal without lesions   Neck supple and no masses   Cardiovascular: regular rate and rhythm   Respiratory:  appears well, vitals normal, no  respiratory distress, acyanotic, normal RR, ear and throat exam is normal, neck free of mass or lymphadenopathy, chest clear, no wheezing, crepitations, rhonchi, normal symmetric air entry   Abdomen: soft, non-tender; bowel sounds normal; no masses,  no organomegaly   Urinary: urethral meatus normal      Assessment:    Pregnancy: [id number] Patient Active Problem List   Diagnosis Date Noted  . Depression 05/25/2016  . History of migraine 05/25/2016  . Carpal tunnel syndrome during pregnancy 05/25/2016  . Gestational hypertension 05/19/2016  . History of cesarean delivery, antepartum 05/18/2016        Plan:     Initial labs drawn. Prenatal vitamins. Problem list reviewed and updated. Genetic Screening discussed Quad Screen: too late.  Ultrasound discussed; fetal survey: ordered.  Follow up in 1 weeks. 50% of 30 min visit spent on counseling and coordination of care.   Routines reviewed Welcomed to practice Patient very tearful, high depression score, will see counselor today Discussed preeclampsia States baby has not moved much in 2 days Had [facility] eval a week ago Will repeat today in  [facility] with NST Will get Glucola and counseling done before she goes to [facility]    [facility] 05/25/2016
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
## 2016-05-25 NOTE — Progress Notes (Signed)
Pt c/o depression/anxiety, heartburn, pain in chest Initial prenatal labs today Tdap today

## 2016-05-25 NOTE — Discharge Instructions (Signed)
Hypertension During Pregnancy Hypertension is also called high blood pressure. Blood pressure moves blood in your body. Sometimes, the force that moves the blood becomes too strong. When you are pregnant, this condition should be watched carefully. It can cause problems for you and your baby. HOME CARE   Make and keep all of your doctor visits.  Take medicine as told by your doctor. Tell your doctor about all medicines you take.  Eat very little salt.  Exercise regularly.  Do not drink alcohol.  Do not smoke.  Do not have drinks with caffeine.  Lie on your left side when resting.  Your health care provider may ask you to take one low-dose aspirin (81mg ) each day. GET HELP RIGHT AWAY IF:  You have bad belly (abdominal) pain.  You have sudden puffiness (swelling) in the hands, ankles, or face.  You gain 4 pounds (1.8 kilograms) or more in 1 week.  You throw up (vomit) repeatedly.  You have bleeding from the vagina.  You do not feel the baby moving as much.  You have a headache.  You have blurred or double vision.  You have muscle twitching or spasms.  You have shortness of breath.  You have blue fingernails and lips.  You have blood in your pee (urine). MAKE SURE YOU:  Understand these instructions.  Will watch your condition.  Will get help right away if you are not doing well or get worse.   This information is not intended to replace advice given to you by your health care provider. Make sure you discuss any questions you have with your health care provider.   Document Released: 12/03/2010 Document Revised: 11/21/2014 Document Reviewed: 05/30/2013 Elsevier Interactive Patient Education [employer].

## 2016-05-25 NOTE — MAU Note (Addendum)
Pt sent from clinic for elevated BP, was there for initial prenatal visit.  Pt C/O HA, blurred vision, no epigastric pain.  Also decreased FM x 2 days.
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
## 2016-05-26 LAB — PRENATAL PROFILE (SOLSTAS)
ANTIBODY SCREEN: NEGATIVE
BASOS PCT: 0 %
Basophils Absolute: 0 cells/uL (ref 0–200)
EOS ABS: 246 {cells}/uL (ref 15–500)
Eosinophils Relative: 2 %
HEMATOCRIT: 36.8 % (ref 35.0–45.0)
HIV 1&2 Ab, 4th Generation: NONREACTIVE
Hemoglobin: 12.5 g/dL (ref 11.7–15.5)
Hepatitis B Surface Ag: NEGATIVE
LYMPHS ABS: 2214 {cells}/uL (ref 850–3900)
Lymphocytes Relative: 18 %
MCH: 30.6 pg (ref 27.0–33.0)
MCHC: 34 g/dL (ref 32.0–36.0)
MCV: 90 fL (ref 80.0–100.0)
MONO ABS: 984 {cells}/uL — AB (ref 200–950)
MPV: 10.1 fL (ref 7.5–12.5)
Monocytes Relative: 8 %
NEUTROS ABS: 8856 {cells}/uL — AB (ref 1500–7800)
Neutrophils Relative %: 72 %
PLATELETS: 371 10*3/uL (ref 140–400)
RBC: 4.09 MIL/uL (ref 3.80–5.10)
RDW: 14 % (ref 11.0–15.0)
RH TYPE: POSITIVE
Rubella: 1.53 Index — ABNORMAL HIGH (ref <0.90)
WBC: 12.3 10*3/uL — ABNORMAL HIGH (ref 3.8–10.8)

## 2016-05-26 LAB — GLUCOSE TOLERANCE, 1 HOUR (50G) W/O FASTING: Glucose, 1 Hr, gestational: 107 mg/dL (ref <140)

## 2016-05-26 LAB — GC/CHLAMYDIA PROBE AMP (~~LOC~~) NOT AT ARMC
CHLAMYDIA, DNA PROBE: NEGATIVE
NEISSERIA GONORRHEA: NEGATIVE

## 2016-05-27 ENCOUNTER — Inpatient Hospital Stay (HOSPITAL_COMMUNITY): Admission: RE | Admit: 2016-05-27 | Payer: Medicaid Other | Source: Ambulatory Visit

## 2016-05-29 ENCOUNTER — Encounter (HOSPITAL_COMMUNITY): Payer: Self-pay | Admitting: *Deleted

## 2016-05-29 ENCOUNTER — Inpatient Hospital Stay (HOSPITAL_COMMUNITY)
Admission: AD | Admit: 2016-05-29 | Discharge: 2016-05-29 | Disposition: A | Discharge status: Home / Self Care | Payer: Medicaid Other | Source: Ambulatory Visit | Attending: Obstetrics & Gynecology | Admitting: Obstetrics & Gynecology

## 2016-05-29 DIAGNOSIS — R079 Chest pain, unspecified: Secondary | ICD-10-CM | POA: Diagnosis not present

## 2016-05-29 DIAGNOSIS — R51 Headache: Secondary | ICD-10-CM | POA: Diagnosis not present

## 2016-05-29 DIAGNOSIS — O133 Gestational [pregnancy-induced] hypertension without significant proteinuria, third trimester: Secondary | ICD-10-CM

## 2016-05-29 DIAGNOSIS — F329 Major depressive disorder, single episode, unspecified: Secondary | ICD-10-CM | POA: Diagnosis not present

## 2016-05-29 DIAGNOSIS — R12 Heartburn: Secondary | ICD-10-CM | POA: Insufficient documentation

## 2016-05-29 DIAGNOSIS — O99340 Other mental disorders complicating pregnancy, unspecified trimester: Secondary | ICD-10-CM | POA: Insufficient documentation

## 2016-05-29 DIAGNOSIS — M7989 Other specified soft tissue disorders: Secondary | ICD-10-CM | POA: Insufficient documentation

## 2016-05-29 DIAGNOSIS — Z3A32 32 weeks gestation of pregnancy: Secondary | ICD-10-CM

## 2016-05-29 DIAGNOSIS — O99330 Smoking (tobacco) complicating pregnancy, unspecified trimester: Secondary | ICD-10-CM | POA: Insufficient documentation

## 2016-05-29 DIAGNOSIS — Z3A00 Weeks of gestation of pregnancy not specified: Secondary | ICD-10-CM | POA: Insufficient documentation

## 2016-05-29 DIAGNOSIS — O368190 Decreased fetal movements, unspecified trimester, not applicable or unspecified: Secondary | ICD-10-CM | POA: Insufficient documentation

## 2016-05-29 DIAGNOSIS — F419 Anxiety disorder, unspecified: Secondary | ICD-10-CM | POA: Diagnosis not present

## 2016-05-29 DIAGNOSIS — O139 Gestational [pregnancy-induced] hypertension without significant proteinuria, unspecified trimester: Secondary | ICD-10-CM | POA: Insufficient documentation

## 2016-05-29 DIAGNOSIS — F17210 Nicotine dependence, cigarettes, uncomplicated: Secondary | ICD-10-CM | POA: Insufficient documentation

## 2016-05-29 DIAGNOSIS — O34219 Maternal care for unspecified type scar from previous cesarean delivery: Secondary | ICD-10-CM

## 2016-05-29 LAB — CBC WITH DIFFERENTIAL/PLATELET
BASOS PCT: 0 %
Basophils Absolute: 0 10*3/uL (ref 0.0–0.1)
EOS ABS: 0.3 10*3/uL (ref 0.0–0.7)
Eosinophils Relative: 3 %
HCT: 31.9 % — ABNORMAL LOW (ref 36.0–46.0)
HEMOGLOBIN: 11.1 g/dL — AB (ref 12.0–15.0)
LYMPHS ABS: 2.5 10*3/uL (ref 0.7–4.0)
LYMPHS PCT: 22 %
MCH: 30.7 pg (ref 26.0–34.0)
MCHC: 34.8 g/dL (ref 30.0–36.0)
MCV: 88.1 fL (ref 78.0–100.0)
Monocytes Absolute: 0.7 10*3/uL (ref 0.1–1.0)
Monocytes Relative: 6 %
NEUTROS ABS: 7.8 10*3/uL — AB (ref 1.7–7.7)
NEUTROS PCT: 69 %
Platelets: 345 10*3/uL (ref 150–400)
RBC: 3.62 MIL/uL — AB (ref 3.87–5.11)
RDW: 13.6 % (ref 11.5–15.5)
WBC: 11.4 10*3/uL — ABNORMAL HIGH (ref 4.0–10.5)

## 2016-05-29 LAB — URINALYSIS, ROUTINE W REFLEX MICROSCOPIC
BILIRUBIN URINE: NEGATIVE
Glucose, UA: NEGATIVE mg/dL
Hgb urine dipstick: NEGATIVE
KETONES UR: NEGATIVE mg/dL
LEUKOCYTES UA: NEGATIVE
NITRITE: NEGATIVE
Protein, ur: NEGATIVE mg/dL
SPECIFIC GRAVITY, URINE: 1.01 (ref 1.005–1.030)
pH: 6 (ref 5.0–8.0)

## 2016-05-29 LAB — COMPREHENSIVE METABOLIC PANEL
ALBUMIN: 2.4 g/dL — AB (ref 3.5–5.0)
ALT: 12 U/L — AB (ref 14–54)
AST: 9 U/L — AB (ref 15–41)
Alkaline Phosphatase: 98 U/L (ref 38–126)
Anion gap: 7 (ref 5–15)
BUN: 5 mg/dL — AB (ref 6–20)
CHLORIDE: 106 mmol/L (ref 101–111)
CO2: 22 mmol/L (ref 22–32)
CREATININE: 0.42 mg/dL — AB (ref 0.44–1.00)
Calcium: 8.3 mg/dL — ABNORMAL LOW (ref 8.9–10.3)
GFR calc Af Amer: >60 mL/min (ref >60)
GFR calc non Af Amer: >60 mL/min (ref >60)
GLUCOSE: 80 mg/dL (ref 65–99)
POTASSIUM: 3.8 mmol/L (ref 3.5–5.1)
SODIUM: 135 mmol/L (ref 135–145)
Total Bilirubin: 0.3 mg/dL (ref 0.3–1.2)
Total Protein: 5.5 g/dL — ABNORMAL LOW (ref 6.5–8.1)

## 2016-05-29 LAB — PROTEIN / CREATININE RATIO, URINE
CREATININE, URINE: 59 mg/dL
PROTEIN CREATININE RATIO: 0.17 mg/mg{creat} — AB (ref 0.00–0.15)
TOTAL PROTEIN, URINE: 10 mg/dL

## 2016-05-29 MED ORDER — LABETALOL HCL 100 MG PO TABS: 100.0000 mg | ORAL_TABLET | Freq: Two times a day (BID) | ORAL | Status: DC

## 2016-05-29 MED ORDER — LABETALOL HCL 100 MG PO TABS
100.0000 mg | ORAL_TABLET | Freq: Two times a day (BID) | ORAL | Status: DC
  Administered 2016-05-29: 100 mg via ORAL
  Filled 2016-05-29: qty 1

## 2016-05-29 MED ORDER — GI COCKTAIL ~~LOC~~
30.0000 mL | Freq: Once | ORAL | Status: AC
  Administered 2016-05-29: 30 mL via ORAL
  Filled 2016-05-29: qty 30

## 2016-05-29 NOTE — Discharge Instructions (Signed)
Hypertension During Pregnancy Hypertension is also called high blood pressure. Blood pressure moves blood in your body. Sometimes, the force that moves the blood becomes too strong. When you are pregnant, this condition should be watched carefully. It can cause problems for you and your baby. HOME CARE   Make and keep all of your doctor visits.  Take medicine as told by your doctor. Tell your doctor about all medicines you take.  Eat very little salt.  Exercise regularly.  Do not drink alcohol.  Do not smoke.  Do not have drinks with caffeine.  Lie on your left side when resting.  Your health care provider may ask you to take one low-dose aspirin (81mg ) each day. GET HELP RIGHT AWAY IF:  You have bad belly (abdominal) pain.  You have sudden puffiness (swelling) in the hands, ankles, or face.  You gain 4 pounds (1.8 kilograms) or more in 1 week.  You throw up (vomit) repeatedly.  You have bleeding from the vagina.  You do not feel the baby moving as much.  You have a headache.  You have blurred or double vision.  You have muscle twitching or spasms.  You have shortness of breath.  You have blue fingernails and lips.  You have blood in your pee (urine). MAKE SURE YOU:  Understand these instructions.  Will watch your condition.  Will get help right away if you are not doing well or get worse.   This information is not intended to replace advice given to you by your health care provider. Make sure you discuss any questions you have with your health care provider.   Document Released: 12/03/2010 Document Revised: 11/21/2014 Document Reviewed: 05/30/2013 Elsevier Interactive Patient Education [employer].

## 2016-05-29 NOTE — MAU Provider Note (Signed)
History   25 y.o. In with c/o decreased fetal movement, headache,  swelling, dizziness, and heartburn. State has not taken med for heartburn. The headache is the same as it was several days ago. Pt demanding to be delivered because she is so miserable demanding to be seen by physician.  CSN: [id number]  Arrival date & time 05/29/16  1759   None     Chief Complaint  Patient presents with  . Decreased Fetal Movement  . Leg Swelling  . Chest Pain  . Headache    HPI  Past Medical History  Diagnosis Date  . Depression   . Pregnancy induced hypertension   . Anxiety     Past Surgical History  Procedure Laterality Date  . Cesarean section      Family History  Problem Relation Age of Onset  . Mental illness Father     Social History  Substance Use Topics  . Smoking status: Current Some Day Smoker -- 0.25 packs/day    Types: Cigarettes  . Smokeless tobacco: Never Used  . Alcohol Use: No    OB History    Gravida Para Term Preterm AB TAB SAB Ectopic Multiple Living   6 3 3  2  2   3       Review of Systems  Constitutional: Negative.   Eyes: Negative.   Respiratory: Negative.   Cardiovascular: Positive for chest pain.  Gastrointestinal:       Heartburn  Endocrine: Negative.   Genitourinary: Negative.   Skin: Negative.   Allergic/Immunologic: Negative.   Neurological: Positive for dizziness and headaches.  Hematological: Negative.   Psychiatric/Behavioral: Negative.     Allergies  Morphine and related  Home Medications  No current outpatient prescriptions on file.  BP 139/94 mmHg  Pulse 76  Temp(Src) 98.3 F (36.8 C) (Oral)  Resp 18  LMP 11/10/2015 (Exact Date)  Physical Exam  Constitutional: She is oriented to person, place, and time. She appears well-developed and well-nourished.  HENT:  Head: Normocephalic.  Eyes: Pupils are equal, round, and reactive to light.  Neck: Normal range of motion.  Cardiovascular: Normal rate, regular rhythm, normal  heart sounds and intact distal pulses.   Pulmonary/Chest: Effort normal and breath sounds normal.  Abdominal: Soft. Bowel sounds are normal.  Musculoskeletal: Normal range of motion. She exhibits edema.  Neurological: She is alert and oriented to person, place, and time. She has normal reflexes.  Skin: Skin is warm and dry.  Psychiatric: She has a normal mood and affect. Her behavior is normal. Judgment and thought content normal.    MAU Course  Procedures (including critical care time)  Labs Reviewed  URINALYSIS, ROUTINE W REFLEX MICROSCOPIC (NOT AT [facility])  PROTEIN / CREATININE RATIO, URINE  CBC WITH DIFFERENTIAL/PLATELET  COMPREHENSIVE METABOLIC PANEL   No results found.   1. History of cesarean delivery, antepartum       MDM  FHR reassuring, accels noted, no uc's. Dr. Roselie Awkward in to talk with pt. Labs all WNL. Will d/c home.

## 2016-05-29 NOTE — MAU Note (Signed)
C/o headache, swollen hands and feet; back pain, loss of appetite; blurred vision, occasionally chest pain; desires to have an ultrasound-c/o decreased fetal movement; has u/s schedule on the 25th;

## 2016-05-30 ENCOUNTER — Other Ambulatory Visit: Payer: Self-pay | Admitting: Family Medicine

## 2016-05-31 ENCOUNTER — Ambulatory Visit (INDEPENDENT_AMBULATORY_CARE_PROVIDER_SITE_OTHER): Payer: Medicaid Other | Admitting: Family

## 2016-05-31 VITALS — BP 145/95 | HR 79 | Wt 213.0 lb

## 2016-05-31 DIAGNOSIS — O133 Gestational [pregnancy-induced] hypertension without significant proteinuria, third trimester: Secondary | ICD-10-CM | POA: Diagnosis present

## 2016-05-31 DIAGNOSIS — Z3A33 33 weeks gestation of pregnancy: Secondary | ICD-10-CM

## 2016-05-31 DIAGNOSIS — O34219 Maternal care for unspecified type scar from previous cesarean delivery: Secondary | ICD-10-CM | POA: Diagnosis not present

## 2016-05-31 DIAGNOSIS — O0933 Supervision of pregnancy with insufficient antenatal care, third trimester: Secondary | ICD-10-CM | POA: Diagnosis not present

## 2016-05-31 DIAGNOSIS — O0993 Supervision of high risk pregnancy, unspecified, third trimester: Secondary | ICD-10-CM

## 2016-05-31 DIAGNOSIS — O0990 Supervision of high risk pregnancy, unspecified, unspecified trimester: Secondary | ICD-10-CM | POA: Insufficient documentation

## 2016-05-31 DIAGNOSIS — Z1389 Encounter for screening for other disorder: Secondary | ICD-10-CM

## 2016-05-31 LAB — CYSTIC FIBROSIS DIAGNOSTIC STUDY

## 2016-05-31 LAB — POCT URINALYSIS DIP (DEVICE)
BILIRUBIN URINE: NEGATIVE
Glucose, UA: NEGATIVE mg/dL
Hgb urine dipstick: NEGATIVE
KETONES UR: NEGATIVE mg/dL
LEUKOCYTES UA: NEGATIVE
NITRITE: NEGATIVE
Protein, ur: NEGATIVE mg/dL
Specific Gravity, Urine: 1.015 (ref 1.005–1.030)
Urobilinogen, UA: 0.2 mg/dL (ref 0.0–1.0)
pH: 7 (ref 5.0–8.0)
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
## 2016-05-31 NOTE — Progress Notes (Signed)
Pt started labetalol yesterday (2 doses) and has not yet taken it today. She reports decreased FM x1 week.  MAU visit on 7/16 due to swelling of ankles, decreased FM and headache. Pt has not started taking ASA 81 mg because she did not understand the rationale - thought it was for her headaches.  She states she will begin taking today. [place] scheduled tomorrow.

## 2016-06-01 ENCOUNTER — Inpatient Hospital Stay (HOSPITAL_COMMUNITY)
Admission: AD | Admit: 2016-06-01 | Discharge: 2016-06-11 | DRG: 766 | Disposition: A | Discharge status: Home / Self Care | Payer: Medicaid Other | Source: Ambulatory Visit | Attending: Obstetrics & Gynecology | Admitting: Obstetrics & Gynecology

## 2016-06-01 ENCOUNTER — Ambulatory Visit (HOSPITAL_COMMUNITY)
Admission: RE | Admit: 2016-06-01 | Discharge: 2016-06-01 | Disposition: A | Discharge status: Home / Self Care | Payer: Medicaid Other | Source: Ambulatory Visit | Attending: Obstetrics and Gynecology | Admitting: Obstetrics and Gynecology

## 2016-06-01 ENCOUNTER — Encounter (HOSPITAL_COMMUNITY): Payer: Self-pay

## 2016-06-01 ENCOUNTER — Other Ambulatory Visit: Payer: Self-pay | Admitting: Family

## 2016-06-01 ENCOUNTER — Encounter (HOSPITAL_COMMUNITY): Payer: Self-pay | Admitting: *Deleted

## 2016-06-01 VITALS — BP 139/111 | HR 108 | Wt 215.2 lb

## 2016-06-01 DIAGNOSIS — Z818 Family history of other mental and behavioral disorders: Secondary | ICD-10-CM | POA: Diagnosis not present

## 2016-06-01 DIAGNOSIS — G5600 Carpal tunnel syndrome, unspecified upper limb: Secondary | ICD-10-CM | POA: Diagnosis present

## 2016-06-01 DIAGNOSIS — O1493 Unspecified pre-eclampsia, third trimester: Secondary | ICD-10-CM | POA: Diagnosis present

## 2016-06-01 DIAGNOSIS — Z3A33 33 weeks gestation of pregnancy: Secondary | ICD-10-CM

## 2016-06-01 DIAGNOSIS — O133 Gestational [pregnancy-induced] hypertension without significant proteinuria, third trimester: Secondary | ICD-10-CM | POA: Insufficient documentation

## 2016-06-01 DIAGNOSIS — Z3A34 34 weeks gestation of pregnancy: Secondary | ICD-10-CM

## 2016-06-01 DIAGNOSIS — O0933 Supervision of pregnancy with insufficient antenatal care, third trimester: Secondary | ICD-10-CM

## 2016-06-01 DIAGNOSIS — Z36 Encounter for antenatal screening of mother: Secondary | ICD-10-CM | POA: Insufficient documentation

## 2016-06-01 DIAGNOSIS — F17210 Nicotine dependence, cigarettes, uncomplicated: Secondary | ICD-10-CM | POA: Diagnosis present

## 2016-06-01 DIAGNOSIS — F329 Major depressive disorder, single episode, unspecified: Secondary | ICD-10-CM | POA: Diagnosis present

## 2016-06-01 DIAGNOSIS — O99344 Other mental disorders complicating childbirth: Secondary | ICD-10-CM | POA: Diagnosis present

## 2016-06-01 DIAGNOSIS — O99334 Smoking (tobacco) complicating childbirth: Secondary | ICD-10-CM | POA: Diagnosis present

## 2016-06-01 DIAGNOSIS — Z885 Allergy status to narcotic agent status: Secondary | ICD-10-CM | POA: Diagnosis not present

## 2016-06-01 DIAGNOSIS — R519 Headache, unspecified: Secondary | ICD-10-CM

## 2016-06-01 DIAGNOSIS — O34211 Maternal care for low transverse scar from previous cesarean delivery: Secondary | ICD-10-CM | POA: Diagnosis present

## 2016-06-01 DIAGNOSIS — O0993 Supervision of high risk pregnancy, unspecified, third trimester: Secondary | ICD-10-CM

## 2016-06-01 DIAGNOSIS — R51 Headache: Secondary | ICD-10-CM

## 2016-06-01 DIAGNOSIS — O1413 Severe pre-eclampsia, third trimester: Secondary | ICD-10-CM | POA: Diagnosis present

## 2016-06-01 DIAGNOSIS — Z98891 History of uterine scar from previous surgery: Secondary | ICD-10-CM

## 2016-06-01 DIAGNOSIS — O1410 Severe pre-eclampsia, unspecified trimester: Secondary | ICD-10-CM

## 2016-06-01 DIAGNOSIS — O1414 Severe pre-eclampsia complicating childbirth: Principal | ICD-10-CM | POA: Diagnosis present

## 2016-06-01 DIAGNOSIS — O139 Gestational [pregnancy-induced] hypertension without significant proteinuria, unspecified trimester: Secondary | ICD-10-CM

## 2016-06-01 DIAGNOSIS — Z1389 Encounter for screening for other disorder: Secondary | ICD-10-CM

## 2016-06-01 DIAGNOSIS — O34219 Maternal care for unspecified type scar from previous cesarean delivery: Secondary | ICD-10-CM | POA: Insufficient documentation

## 2016-06-01 LAB — CBC WITH DIFFERENTIAL/PLATELET
BASOS PCT: 0 %
Basophils Absolute: 0 10*3/uL (ref 0.0–0.1)
EOS ABS: 0.3 10*3/uL (ref 0.0–0.7)
EOS PCT: 2 %
HCT: 35 % — ABNORMAL LOW (ref 36.0–46.0)
Hemoglobin: 12.2 g/dL (ref 12.0–15.0)
LYMPHS ABS: 2.4 10*3/uL (ref 0.7–4.0)
Lymphocytes Relative: 19 %
MCH: 30.8 pg (ref 26.0–34.0)
MCHC: 34.9 g/dL (ref 30.0–36.0)
MCV: 88.4 fL (ref 78.0–100.0)
MONOS PCT: 6 %
Monocytes Absolute: 0.7 10*3/uL (ref 0.1–1.0)
Neutro Abs: 9 10*3/uL — ABNORMAL HIGH (ref 1.7–7.7)
Neutrophils Relative %: 73 %
PLATELETS: 373 10*3/uL (ref 150–400)
RBC: 3.96 MIL/uL (ref 3.87–5.11)
RDW: 14 % (ref 11.5–15.5)
WBC: 12.3 10*3/uL — ABNORMAL HIGH (ref 4.0–10.5)

## 2016-06-01 LAB — ABO/RH: ABO/RH(D): O POS

## 2016-06-01 LAB — COMPREHENSIVE METABOLIC PANEL
ALK PHOS: 110 U/L (ref 38–126)
ALT: 13 U/L — ABNORMAL LOW (ref 14–54)
ANION GAP: 7 (ref 5–15)
AST: 11 U/L — ABNORMAL LOW (ref 15–41)
Albumin: 2.7 g/dL — ABNORMAL LOW (ref 3.5–5.0)
BUN: 5 mg/dL — ABNORMAL LOW (ref 6–20)
CALCIUM: 9.6 mg/dL (ref 8.9–10.3)
CHLORIDE: 106 mmol/L (ref 101–111)
CO2: 23 mmol/L (ref 22–32)
Creatinine, Ser: 0.51 mg/dL (ref 0.44–1.00)
GFR calc non Af Amer: >60 mL/min (ref >60)
Glucose, Bld: 82 mg/dL (ref 65–99)
POTASSIUM: 4 mmol/L (ref 3.5–5.1)
SODIUM: 136 mmol/L (ref 135–145)
Total Bilirubin: 0.5 mg/dL (ref 0.3–1.2)
Total Protein: 6.3 g/dL — ABNORMAL LOW (ref 6.5–8.1)

## 2016-06-01 LAB — TYPE AND SCREEN
ABO/RH(D): O POS
Antibody Screen: NEGATIVE

## 2016-06-01 LAB — PROTEIN / CREATININE RATIO, URINE
Creatinine, Urine: 66 mg/dL
PROTEIN CREATININE RATIO: 0.14 mg/mg{creat} (ref 0.00–0.15)
TOTAL PROTEIN, URINE: 9 mg/dL

## 2016-06-01 LAB — URINALYSIS, ROUTINE W REFLEX MICROSCOPIC
BILIRUBIN URINE: NEGATIVE
Glucose, UA: NEGATIVE mg/dL
HGB URINE DIPSTICK: NEGATIVE
KETONES UR: NEGATIVE mg/dL
Leukocytes, UA: NEGATIVE
Nitrite: NEGATIVE
PH: 6.5 (ref 5.0–8.0)
Protein, ur: NEGATIVE mg/dL

## 2016-06-01 MED ORDER — OXYCODONE-ACETAMINOPHEN 5-325 MG PO TABS
1.0000 | ORAL_TABLET | Freq: Four times a day (QID) | ORAL | Status: DC | PRN
  Administered 2016-06-01 – 2016-06-07 (×15): 1 via ORAL
  Filled 2016-06-01 (×15): qty 1

## 2016-06-01 MED ORDER — CALCIUM CARBONATE ANTACID 500 MG PO CHEW
2.0000 | CHEWABLE_TABLET | ORAL | Status: DC | PRN
  Administered 2016-06-01 – 2016-06-04 (×5): 400 mg via ORAL
  Filled 2016-06-01 (×5): qty 2

## 2016-06-01 MED ORDER — DOCUSATE SODIUM 100 MG PO CAPS
100.0000 mg | ORAL_CAPSULE | Freq: Every day | ORAL | Status: DC | PRN
  Administered 2016-06-02 – 2016-06-05 (×2): 100 mg via ORAL
  Filled 2016-06-01 (×2): qty 1

## 2016-06-01 MED ORDER — LABETALOL HCL 100 MG PO TABS
100.0000 mg | ORAL_TABLET | Freq: Two times a day (BID) | ORAL | Status: DC
  Administered 2016-06-01: 100 mg via ORAL
  Filled 2016-06-01: qty 1

## 2016-06-01 MED ORDER — ACETAMINOPHEN 500 MG PO TABS
1000.0000 mg | ORAL_TABLET | Freq: Once | ORAL | Status: AC
  Administered 2016-06-01: 1000 mg via ORAL
  Filled 2016-06-01: qty 2

## 2016-06-01 MED ORDER — BUTORPHANOL TARTRATE 1 MG/ML IJ SOLN: 1.0000 mg | Freq: Once | INTRAMUSCULAR | Status: DC

## 2016-06-01 MED ORDER — BETAMETHASONE SOD PHOS & ACET 6 (3-3) MG/ML IJ SUSP
12.0000 mg | Freq: Once | INTRAMUSCULAR | Status: AC
  Administered 2016-06-02: 12 mg via INTRAMUSCULAR
  Filled 2016-06-01: qty 2

## 2016-06-01 MED ORDER — PRENATAL MULTIVITAMIN CH
1.0000 | ORAL_TABLET | Freq: Every day | ORAL | Status: DC
  Administered 2016-06-02 – 2016-06-11 (×9): 1 via ORAL
  Filled 2016-06-01 (×10): qty 1

## 2016-06-01 MED ORDER — BUTALBITAL-APAP-CAFFEINE 50-325-40 MG PO TABS
2.0000 | ORAL_TABLET | Freq: Once | ORAL | Status: AC
  Administered 2016-06-02: 2 via ORAL
  Filled 2016-06-01 (×2): qty 2

## 2016-06-01 MED ORDER — LABETALOL HCL 5 MG/ML IV SOLN
20.0000 mg | INTRAVENOUS | Status: DC | PRN
  Administered 2016-06-01 – 2016-06-05 (×2): 20 mg via INTRAVENOUS
  Filled 2016-06-01 (×2): qty 4

## 2016-06-01 MED ORDER — BETAMETHASONE SOD PHOS & ACET 6 (3-3) MG/ML IJ SUSP
12.0000 mg | Freq: Once | INTRAMUSCULAR | Status: AC
  Administered 2016-06-01: 12 mg via INTRAMUSCULAR
  Filled 2016-06-01 (×2): qty 2

## 2016-06-01 MED ORDER — HYDRALAZINE HCL 20 MG/ML IJ SOLN: 10.0000 mg | Freq: Once | INTRAMUSCULAR | Status: DC | PRN

## 2016-06-01 MED ORDER — ACETAMINOPHEN 325 MG PO TABS
650.0000 mg | ORAL_TABLET | ORAL | Status: DC | PRN
  Administered 2016-06-08: 650 mg via ORAL
  Filled 2016-06-01: qty 2

## 2016-06-01 MED ORDER — SODIUM CHLORIDE 0.9% FLUSH
3.0000 mL | Freq: Two times a day (BID) | INTRAVENOUS | Status: DC
  Administered 2016-06-01 – 2016-06-08 (×14): 3 mL via INTRAVENOUS

## 2016-06-01 NOTE — MAU Note (Signed)
Pt presents to MAU from MFM for monitoring for increase in BP. Denies any headache of blurred vision

## 2016-06-01 NOTE — Addendum Note (Signed)
Encounter addended by: Gwen Pounds, CNM on: 06/01/2016  1:48 PM<BR>     Documentation filed: Problem List
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
## 2016-06-01 NOTE — Progress Notes (Signed)
Visited with Ms. Chapell re her intractable HA. She describes her HA as being unilateral on the left-side. She has had these types of HAs off and on through her pregnancy and she occasionally gets blury vision on the side of the headache. She did not have HAs prior to pregnancy. Tylenol alone does not help the HAs. She reports that she came in a few weeks ago and was treated with a combo of benadryl/reglan/decodran that seemed to help, percocet also helps but according to her for a shorter period of time.  Discussed that her HA this afternoon/evening may not be related to her BPs given that her pressures have all been less than 140s/90s since 3 pm.   Ordered oxycodone-acetaminophen 5-325 q 6hrs as needed for severe HA. May consider the benadryl/reglan/decodran if the percocet doesn't work.  Casimer Leek, MD, PGY-1, MPH

## 2016-06-01 NOTE — ED Notes (Signed)
Report called to Ginger, RN.  Pt to go to MAU for evaluation.

## 2016-06-01 NOTE — MAU Note (Signed)
Urine in lab 

## 2016-06-02 ENCOUNTER — Inpatient Hospital Stay (HOSPITAL_COMMUNITY): Payer: Medicaid Other

## 2016-06-02 DIAGNOSIS — O1413 Severe pre-eclampsia, third trimester: Secondary | ICD-10-CM

## 2016-06-02 DIAGNOSIS — O34219 Maternal care for unspecified type scar from previous cesarean delivery: Secondary | ICD-10-CM

## 2016-06-02 DIAGNOSIS — Z3A33 33 weeks gestation of pregnancy: Secondary | ICD-10-CM

## 2016-06-02 LAB — PROTEIN, URINE, 24 HOUR
Collection Interval-UPROT: 24 hours
PROTEIN, 24H URINE: 273 mg/d — AB (ref 50–100)
Protein, Urine: 10 mg/dL
URINE TOTAL VOLUME-UPROT: 2725 mL

## 2016-06-02 MED ORDER — FENTANYL CITRATE (PF) 100 MCG/2ML IJ SOLN
50.0000 ug | Freq: Once | INTRAMUSCULAR | Status: AC
  Administered 2016-06-02: 50 ug via INTRAVENOUS
  Filled 2016-06-02: qty 2

## 2016-06-02 MED ORDER — LORAZEPAM 1 MG PO TABS
1.0000 mg | ORAL_TABLET | Freq: Once | ORAL | Status: AC
Start: 2016-06-02 — End: 2016-06-02
  Administered 2016-06-02: 1 mg via ORAL
  Filled 2016-06-02: qty 1

## 2016-06-02 MED ORDER — PANTOPRAZOLE SODIUM 40 MG PO TBEC
40.0000 mg | DELAYED_RELEASE_TABLET | Freq: Every day | ORAL | Status: DC
  Administered 2016-06-02 – 2016-06-11 (×10): 40 mg via ORAL
  Filled 2016-06-02 (×10): qty 1

## 2016-06-02 NOTE — Progress Notes (Signed)
[facility] here to transport to [facility] for MRI
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
## 2016-06-02 NOTE — Progress Notes (Signed)
Pt is set up to have an MRI this afternoon to evaluate her HAs. She has concerns regarding the imaging and requested to talk to a doctor about the need for this. I explained to the pt that we are not 100% convinced that her HAs are 2/2 her blood pressures and that this will give [place] more information for decision making. Pt was reassured by this conversation and will go ahead with the imaging.  Casimer Leek, MD PGY-1, MPH

## 2016-06-03 DIAGNOSIS — O34219 Maternal care for unspecified type scar from previous cesarean delivery: Secondary | ICD-10-CM

## 2016-06-03 DIAGNOSIS — O1413 Severe pre-eclampsia, third trimester: Secondary | ICD-10-CM | POA: Diagnosis not present

## 2016-06-03 DIAGNOSIS — O1493 Unspecified pre-eclampsia, third trimester: Secondary | ICD-10-CM

## 2016-06-03 LAB — COMPREHENSIVE METABOLIC PANEL
ALBUMIN: 2.4 g/dL — AB (ref 3.5–5.0)
ALK PHOS: 98 U/L (ref 38–126)
ALT: 14 U/L (ref 14–54)
ANION GAP: 5 (ref 5–15)
AST: 11 U/L — AB (ref 15–41)
BILIRUBIN TOTAL: 0.4 mg/dL (ref 0.3–1.2)
BUN: 8 mg/dL (ref 6–20)
CALCIUM: 8.6 mg/dL — AB (ref 8.9–10.3)
CO2: 22 mmol/L (ref 22–32)
CREATININE: 0.48 mg/dL (ref 0.44–1.00)
Chloride: 108 mmol/L (ref 101–111)
GFR calc Af Amer: >60 mL/min (ref >60)
GFR calc non Af Amer: >60 mL/min (ref >60)
GLUCOSE: 92 mg/dL (ref 65–99)
Potassium: 4.3 mmol/L (ref 3.5–5.1)
Sodium: 135 mmol/L (ref 135–145)
TOTAL PROTEIN: 5.9 g/dL — AB (ref 6.5–8.1)

## 2016-06-03 LAB — CBC
HEMATOCRIT: 31.4 % — AB (ref 36.0–46.0)
HEMOGLOBIN: 10.7 g/dL — AB (ref 12.0–15.0)
MCH: 30.7 pg (ref 26.0–34.0)
MCHC: 34.1 g/dL (ref 30.0–36.0)
MCV: 90.2 fL (ref 78.0–100.0)
Platelets: 365 10*3/uL (ref 150–400)
RBC: 3.48 MIL/uL — ABNORMAL LOW (ref 3.87–5.11)
RDW: 14.3 % (ref 11.5–15.5)
WBC: 21.6 10*3/uL — ABNORMAL HIGH (ref 4.0–10.5)

## 2016-06-03 MED ORDER — ZOLPIDEM TARTRATE 5 MG PO TABS
5.0000 mg | ORAL_TABLET | Freq: Every evening | ORAL | Status: DC | PRN
  Administered 2016-06-03 – 2016-06-07 (×2): 5 mg via ORAL
  Filled 2016-06-03 (×2): qty 1

## 2016-06-03 MED ORDER — OXYCODONE-ACETAMINOPHEN 5-325 MG PO TABS
2.0000 | ORAL_TABLET | Freq: Once | ORAL | Status: AC
  Administered 2016-06-03: 2 via ORAL
  Filled 2016-06-03: qty 2
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
## 2016-06-04 LAB — TYPE AND SCREEN
ABO/RH(D): O POS
ANTIBODY SCREEN: NEGATIVE

## 2016-06-04 LAB — CULTURE, BETA STREP (GROUP B ONLY)

## 2016-06-04 LAB — OB RESULTS CONSOLE GBS: GBS: NEGATIVE
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
## 2016-06-05 ENCOUNTER — Encounter (HOSPITAL_COMMUNITY): Payer: Self-pay
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
## 2016-06-06 ENCOUNTER — Other Ambulatory Visit: Payer: Self-pay | Admitting: Obstetrics & Gynecology

## 2016-06-06 DIAGNOSIS — Z3A33 33 weeks gestation of pregnancy: Secondary | ICD-10-CM

## 2016-06-06 DIAGNOSIS — O1413 Severe pre-eclampsia, third trimester: Secondary | ICD-10-CM

## 2016-06-06 LAB — COMPREHENSIVE METABOLIC PANEL
ALK PHOS: 103 U/L (ref 38–126)
ALT: 13 U/L — AB (ref 14–54)
AST: 8 U/L — AB (ref 15–41)
Albumin: 2.4 g/dL — ABNORMAL LOW (ref 3.5–5.0)
Anion gap: 6 (ref 5–15)
BUN: 9 mg/dL (ref 6–20)
CALCIUM: 8.6 mg/dL — AB (ref 8.9–10.3)
CHLORIDE: 105 mmol/L (ref 101–111)
CO2: 23 mmol/L (ref 22–32)
CREATININE: 0.42 mg/dL — AB (ref 0.44–1.00)
Glucose, Bld: 88 mg/dL (ref 65–99)
Potassium: 4.2 mmol/L (ref 3.5–5.1)
Sodium: 134 mmol/L — ABNORMAL LOW (ref 135–145)
TOTAL PROTEIN: 5.4 g/dL — AB (ref 6.5–8.1)
Total Bilirubin: 0.3 mg/dL (ref 0.3–1.2)

## 2016-06-06 LAB — CBC
HCT: 32.6 % — ABNORMAL LOW (ref 36.0–46.0)
Hemoglobin: 11.3 g/dL — ABNORMAL LOW (ref 12.0–15.0)
MCH: 30.6 pg (ref 26.0–34.0)
MCHC: 34.7 g/dL (ref 30.0–36.0)
MCV: 88.3 fL (ref 78.0–100.0)
PLATELETS: 332 10*3/uL (ref 150–400)
RBC: 3.69 MIL/uL — AB (ref 3.87–5.11)
RDW: 13.8 % (ref 11.5–15.5)
WBC: 12.2 10*3/uL — AB (ref 4.0–10.5)

## 2016-06-06 MED ORDER — LORAZEPAM 1 MG PO TABS
0.5000 mg | ORAL_TABLET | Freq: Once | ORAL | Status: AC
  Administered 2016-06-06: 0.5 mg via ORAL
  Filled 2016-06-06: qty 1

## 2016-06-06 MED ORDER — LABETALOL HCL 100 MG PO TABS
200.0000 mg | ORAL_TABLET | Freq: Two times a day (BID) | ORAL | Status: DC
  Administered 2016-06-06 – 2016-06-08 (×5): 200 mg via ORAL
  Filled 2016-06-06 (×5): qty 2

## 2016-06-06 NOTE — Progress Notes (Signed)
Patient ID: [name] [name], female   DOB: 1991/02/22, 25 y.o.   MRN: [id number] [place]) NOTE  [name] [name] is a 25 y.o. [id number] at [redacted]w[redacted]d by best clinical estimate who is admitted for pre-eclampsia with severe features.   Fetal presentation is cephalic. Length of Stay:  5  Days  Subjective: Reports headache which is intermittent. Reports IV arm pain and increasing anxiety around this. Patient reports the fetal movement as active. Patient reports uterine contraction  activity as none. Patient reports  vaginal bleeding as none. Patient describes fluid per vagina as None.  Vitals:  Blood pressure (!) 135/104, pulse 88, temperature 98.9 F (37.2 C), temperature source Oral, resp. rate 16, height 5' 3.5" (1.613 m), weight 215 lb 3.2 oz (97.6 kg), last menstrual period 11/10/2015, SpO2 99 %.        135/104             142/86             149/99             147/98             140/97             155/110             154/106             153/111         Physical Examination:  General appearance - alert, well appearing, and in no distress Chest - normal effort Abdomen - gravid, NT Fundal Height:  size equals dates Extremities: Homans sign is negative, no sign of DVT  Membranes:intact  Fetal Monitoring:  Baseline: 145 bpm, Variability: Good {> 6 bpm), Accelerations: Reactive and Decelerations: Absent  Labs:  Results for orders placed or performed during the hospital encounter of 06/01/16 (from the past 24 hour(s))  Comprehensive metabolic panel   Collection Time: 06/06/16  6:11 AM  Result Value Ref Range   Sodium 134 (L) 135 - 145 mmol/L   Potassium 4.2 3.5 - 5.1 mmol/L   Chloride 105 101 - 111 mmol/L   CO2 23 22 - 32 mmol/L   Glucose, Bld 88 65 - 99 mg/dL   BUN 9 6 - 20 mg/dL   Creatinine, Ser 0.42 (L) 0.44 - 1.00 mg/dL   Calcium 8.6 (L) 8.9 - 10.3 mg/dL   Total Protein 5.4 (L) 6.5 - 8.1 g/dL   Albumin 2.4 (L) 3.5 - 5.0 g/dL   AST 8  (L) 15 - 41 U/L   ALT 13 (L) 14 - 54 U/L   Alkaline Phosphatase 103 38 - 126 U/L   Total Bilirubin 0.3 0.3 - 1.2 mg/dL   GFR calc non Af Amer >60 >60 mL/min   GFR calc Af Amer >60 >60 mL/min   Anion gap 6 5 - 15  CBC   Collection Time: 06/06/16  6:11 AM  Result Value Ref Range   WBC 12.2 (H) 4.0 - 10.5 K/uL   RBC 3.69 (L) 3.87 - 5.11 MIL/uL   Hemoglobin 11.3 (L) 12.0 - 15.0 g/dL   HCT 32.6 (L) 36.0 - 46.0 %   MCV 88.3 78.0 - 100.0 fL   MCH 30.6 26.0 - 34.0 pg   MCHC 34.7 30.0 - 36.0 g/dL   RDW 13.8 11.5 - 15.5 %   Platelets 332 150 - 400 K/uL    Medications:  Scheduled . labetalol  200 mg Oral BID  . LORazepam  0.5  mg Oral Once  . pantoprazole  40 mg Oral Daily  . prenatal multivitamin  1 tablet Oral Q1200  . sodium chloride flush  3 mL Intravenous Q12H   I have reviewed the patient's current medications.  ASSESSMENT: Principal Problem:   Pre-eclampsia, severe, antepartum Active Problems:   History of cesarean delivery, antepartum   PLAN: For RLTCS and BTL at 34 wks Worsening BP in last 24 hours--add po labetalol for persistent DBP > 110. Ativan x 1 for anxiety.  Donnamae Jude, MD 06/06/2016,10:15 AM

## 2016-06-07 ENCOUNTER — Other Ambulatory Visit (HOSPITAL_COMMUNITY): Payer: Medicaid Other

## 2016-06-07 LAB — TYPE AND SCREEN
ABO/RH(D): O POS
ANTIBODY SCREEN: NEGATIVE

## 2016-06-07 MED ORDER — SALINE SPRAY 0.65 % NA SOLN
1.0000 | NASAL | Status: DC | PRN
  Administered 2016-06-07: 1 via NASAL
  Filled 2016-06-07: qty 44

## 2016-06-07 NOTE — Progress Notes (Signed)
S: Ms. [name] [name] (who is scheduled for a CS tomorrow) has concerns this evening about her sinuses and wonders if maybe she has a sinus infection. She reports that she has had more nasal mucus than usual and when she wakes up in the morning she has much phlegm in her throat. She was given a bottle of saline rinse and she reports that using it helps. She also states that steam from the shower helps. Of note, her 27 yo son who was visiting also has nasal symptoms.  O: On exam she has no tenderness to palpation of the sinuses. Lungs are clear bilaterally to auscultation.  A & P: Ms. [name] [name]'s sx are unlikely 2/2 sinus infection given her lack of PE findings. More likely this is viral or environmental.  Suggested that she keep using the saline rinse as needed and shower steam to loosen mucus. Wished her well on her CS tomorrow.

## 2016-06-07 NOTE — Progress Notes (Signed)
Patient ID: [name] [name], female   DOB: 12-12-1990, 25 y.o.   MRN: [id number] [place]) NOTE  [name] [name] is a 25 y.o. [id number] at [redacted]w[redacted]d by best clinical estimate who is admitted for pre-eclampsia with severe features.   Fetal presentation is cephalic. Length of Stay:  6  Days  Subjective: No headache today. BP is improved with Labetalol--no severe range BP's Patient reports the fetal movement as active. Patient reports uterine contraction  activity as none. Patient reports  vaginal bleeding as none. Patient describes fluid per vagina as None.  Vitals:  Blood pressure 118/77, pulse 90, temperature 97.9 F (36.6 C), temperature source Oral, resp. rate 18, height 5' 3.5" (1.613 m), weight 215 lb 3.2 oz (97.6 kg), last menstrual period 11/10/2015, SpO2 99 %. Physical Examination:  General appearance - alert, well appearing, and in no distress Chest - normal effort Abdomen - gravid, NT Fundal Height:  size equals dates Extremities: Homans sign is negative, no sign of DVT  Membranes:intact  Fetal Monitoring:  Baseline: 135 bpm, Variability: Good {> 6 bpm), Accelerations: Reactive and Decelerations: Absent   Medications:  Scheduled . labetalol  200 mg Oral BID  . pantoprazole  40 mg Oral Daily  . prenatal multivitamin  1 tablet Oral Q1200  . sodium chloride flush  3 mL Intravenous Q12H   I have reviewed the patient's current medications.  ASSESSMENT: Principal Problem:   Pre-eclampsia, severe, antepartum Active Problems:   History of cesarean delivery, antepartum   PLAN: For ERLTCS in am with BTL Continue labetalol Repeat labs in am.  Donnamae Jude, MD 06/07/2016,7:04 AM

## 2016-06-08 ENCOUNTER — Inpatient Hospital Stay (HOSPITAL_COMMUNITY): Payer: Medicaid Other | Admitting: Anesthesiology

## 2016-06-08 ENCOUNTER — Encounter (HOSPITAL_COMMUNITY)
Admission: AD | Disposition: A | Discharge status: Home / Self Care | Payer: Self-pay | Source: Ambulatory Visit | Attending: Obstetrics & Gynecology

## 2016-06-08 ENCOUNTER — Encounter (HOSPITAL_COMMUNITY): Payer: Self-pay | Admitting: *Deleted

## 2016-06-08 DIAGNOSIS — O1414 Severe pre-eclampsia complicating childbirth: Secondary | ICD-10-CM

## 2016-06-08 DIAGNOSIS — O34219 Maternal care for unspecified type scar from previous cesarean delivery: Secondary | ICD-10-CM

## 2016-06-08 DIAGNOSIS — Z3A34 34 weeks gestation of pregnancy: Secondary | ICD-10-CM

## 2016-06-08 LAB — COMPREHENSIVE METABOLIC PANEL
ALK PHOS: 102 U/L (ref 38–126)
ALT: 11 U/L — AB (ref 14–54)
AST: 9 U/L — AB (ref 15–41)
Albumin: 2.6 g/dL — ABNORMAL LOW (ref 3.5–5.0)
Anion gap: 6 (ref 5–15)
BUN: 10 mg/dL (ref 6–20)
CALCIUM: 8.4 mg/dL — AB (ref 8.9–10.3)
CHLORIDE: 103 mmol/L (ref 101–111)
CO2: 23 mmol/L (ref 22–32)
CREATININE: 0.49 mg/dL (ref 0.44–1.00)
GFR calc Af Amer: >60 mL/min (ref >60)
Glucose, Bld: 80 mg/dL (ref 65–99)
Potassium: 4.3 mmol/L (ref 3.5–5.1)
Sodium: 132 mmol/L — ABNORMAL LOW (ref 135–145)
Total Bilirubin: 0.3 mg/dL (ref 0.3–1.2)
Total Protein: 6 g/dL — ABNORMAL LOW (ref 6.5–8.1)

## 2016-06-08 LAB — TYPE AND SCREEN
ABO/RH(D): O POS
Antibody Screen: NEGATIVE

## 2016-06-08 LAB — CBC
HCT: 33.6 % — ABNORMAL LOW (ref 36.0–46.0)
HEMOGLOBIN: 11.4 g/dL — AB (ref 12.0–15.0)
MCH: 30.1 pg (ref 26.0–34.0)
MCHC: 33.9 g/dL (ref 30.0–36.0)
MCV: 88.7 fL (ref 78.0–100.0)
PLATELETS: 358 10*3/uL (ref 150–400)
RBC: 3.79 MIL/uL — AB (ref 3.87–5.11)
RDW: 13.9 % (ref 11.5–15.5)
WBC: 14.1 10*3/uL — AB (ref 4.0–10.5)

## 2016-06-08 SURGERY — Surgical Case
Anesthesia: Epidural

## 2016-06-08 MED ORDER — LACTATED RINGERS IV SOLN
INTRAVENOUS | Status: DC
  Administered 2016-06-08 – 2016-06-09 (×2): via INTRAVENOUS

## 2016-06-08 MED ORDER — LACTATED RINGERS IV SOLN
INTRAVENOUS | Status: DC
  Administered 2016-06-08 (×2): via INTRAVENOUS

## 2016-06-08 MED ORDER — MAGNESIUM SULFATE 50 % IJ SOLN
2.0000 g/h | INTRAVENOUS | Status: AC
  Administered 2016-06-09: 2 g/h via INTRAVENOUS
  Filled 2016-06-08 (×2): qty 80

## 2016-06-08 MED ORDER — MAGNESIUM SULFATE BOLUS VIA INFUSION
4.0000 g | Freq: Once | INTRAVENOUS | Status: AC
  Administered 2016-06-08: 4 g via INTRAVENOUS
  Filled 2016-06-08: qty 500

## 2016-06-08 MED ORDER — HYDROMORPHONE HCL 1 MG/ML IJ SOLN: 0.2500 mg | INTRAMUSCULAR | Status: DC | PRN

## 2016-06-08 MED ORDER — MORPHINE SULFATE-NACL 0.5-0.9 MG/ML-% IV SOSY
PREFILLED_SYRINGE | INTRAVENOUS | Status: AC
  Filled 2016-06-08: qty 1

## 2016-06-08 MED ORDER — HYDROMORPHONE HCL 1 MG/ML IJ SOLN
INTRAMUSCULAR | Status: AC
  Filled 2016-06-08: qty 1

## 2016-06-08 MED ORDER — HYDRALAZINE HCL 20 MG/ML IJ SOLN: 10.0000 mg | Freq: Once | INTRAMUSCULAR | Status: DC | PRN

## 2016-06-08 MED ORDER — OXYCODONE HCL 5 MG PO TABS
5.0000 mg | ORAL_TABLET | Freq: Once | ORAL | Status: AC | PRN
Start: 2016-06-08 — End: 2016-06-08
  Administered 2016-06-08: 5 mg via ORAL

## 2016-06-08 MED ORDER — SOD CITRATE-CITRIC ACID 500-334 MG/5ML PO SOLN
30.0000 mL | Freq: Once | ORAL | Status: AC
  Administered 2016-06-08: 30 mL via ORAL
  Filled 2016-06-08: qty 15

## 2016-06-08 MED ORDER — OXYCODONE HCL 5 MG/5ML PO SOLN
5.0000 mg | Freq: Once | ORAL | Status: AC | PRN
Start: 2016-06-08 — End: 2016-06-08

## 2016-06-08 MED ORDER — MIDAZOLAM HCL 2 MG/2ML IJ SOLN
INTRAMUSCULAR | Status: AC
  Filled 2016-06-08: qty 2

## 2016-06-08 MED ORDER — KETOROLAC TROMETHAMINE 30 MG/ML IJ SOLN
30.0000 mg | Freq: Four times a day (QID) | INTRAMUSCULAR | Status: AC | PRN
  Administered 2016-06-08: 30 mg via INTRAVENOUS
  Filled 2016-06-08 (×2): qty 1

## 2016-06-08 MED ORDER — LACTATED RINGERS IV SOLN
INTRAVENOUS | Status: DC | PRN
  Administered 2016-06-08: 12:00:00 via INTRAVENOUS

## 2016-06-08 MED ORDER — DEXAMETHASONE SODIUM PHOSPHATE 4 MG/ML IJ SOLN
INTRAMUSCULAR | Status: AC
  Filled 2016-06-08: qty 1

## 2016-06-08 MED ORDER — OXYTOCIN 10 UNIT/ML IJ SOLN
INTRAMUSCULAR | Status: AC
  Filled 2016-06-08: qty 4

## 2016-06-08 MED ORDER — PHENYLEPHRINE 8 MG IN D5W 100 ML (0.08MG/ML) PREMIX OPTIME
INJECTION | INTRAVENOUS | Status: AC
  Filled 2016-06-08: qty 100

## 2016-06-08 MED ORDER — PHENYLEPHRINE 8 MG IN D5W 100 ML (0.08MG/ML) PREMIX OPTIME
INJECTION | INTRAVENOUS | Status: DC | PRN
  Administered 2016-06-08: 60 ug/min via INTRAVENOUS

## 2016-06-08 MED ORDER — CEFAZOLIN SODIUM-DEXTROSE 2-3 GM-% IV SOLR
INTRAVENOUS | Status: DC | PRN
  Administered 2016-06-08: 2 g via INTRAVENOUS

## 2016-06-08 MED ORDER — BUPIVACAINE IN DEXTROSE 0.75-8.25 % IT SOLN
INTRATHECAL | Status: AC
  Filled 2016-06-08: qty 2

## 2016-06-08 MED ORDER — MEPERIDINE HCL 25 MG/ML IJ SOLN: 6.2500 mg | INTRAMUSCULAR | Status: DC | PRN

## 2016-06-08 MED ORDER — SODIUM CHLORIDE 0.9 % IR SOLN
Status: DC | PRN
  Administered 2016-06-08: 1000 mL

## 2016-06-08 MED ORDER — KETOROLAC TROMETHAMINE 30 MG/ML IJ SOLN
30.0000 mg | Freq: Four times a day (QID) | INTRAMUSCULAR | Status: AC | PRN
  Administered 2016-06-08: 30 mg via INTRAMUSCULAR
  Filled 2016-06-08: qty 1

## 2016-06-08 MED ORDER — ONDANSETRON HCL 4 MG/2ML IJ SOLN
INTRAMUSCULAR | Status: DC | PRN
  Administered 2016-06-08: 4 mg via INTRAVENOUS

## 2016-06-08 MED ORDER — FENTANYL CITRATE (PF) 100 MCG/2ML IJ SOLN
25.0000 ug | INTRAMUSCULAR | Status: DC | PRN
  Administered 2016-06-08: 10 ug via INTRAVENOUS

## 2016-06-08 MED ORDER — OXYTOCIN 10 UNIT/ML IJ SOLN
INTRAVENOUS | Status: DC | PRN
  Administered 2016-06-08: 40 [IU] via INTRAVENOUS
  Administered 2016-06-08: 13:00:00 via INTRAVENOUS

## 2016-06-08 MED ORDER — MIDAZOLAM HCL 2 MG/2ML IJ SOLN
INTRAMUSCULAR | Status: DC | PRN
  Administered 2016-06-08: 2 mg via INTRAVENOUS

## 2016-06-08 MED ORDER — HYDROMORPHONE HCL 1 MG/ML IJ SOLN
INTRAMUSCULAR | Status: DC | PRN
  Administered 2016-06-08 (×2): 1 mg via INTRAVENOUS

## 2016-06-08 MED ORDER — BUPIVACAINE IN DEXTROSE 0.75-8.25 % IT SOLN
INTRATHECAL | Status: DC | PRN
  Administered 2016-06-08: 1.7 mL via INTRATHECAL

## 2016-06-08 MED ORDER — LABETALOL HCL 5 MG/ML IV SOLN: 20.0000 mg | INTRAVENOUS | Status: DC | PRN

## 2016-06-08 MED ORDER — PROMETHAZINE HCL 25 MG/ML IJ SOLN: 6.2500 mg | INTRAMUSCULAR | Status: DC | PRN

## 2016-06-08 MED ORDER — MIDAZOLAM HCL 2 MG/2ML IJ SOLN
2.0000 mg | Freq: Once | INTRAMUSCULAR | Status: AC
  Administered 2016-06-08: 2 mg via INTRAVENOUS

## 2016-06-08 MED ORDER — FENTANYL CITRATE (PF) 100 MCG/2ML IJ SOLN
INTRAMUSCULAR | Status: DC | PRN
  Administered 2016-06-08: 90 ug via INTRAVENOUS

## 2016-06-08 MED ORDER — SODIUM BICARBONATE 8.4 % IV SOLN
INTRAVENOUS | Status: DC | PRN
  Administered 2016-06-08: 5 mL via EPIDURAL
  Administered 2016-06-08: 2 mL via EPIDURAL
  Administered 2016-06-08: 3 mL via EPIDURAL

## 2016-06-08 MED ORDER — OXYCODONE HCL 5 MG PO TABS
ORAL_TABLET | ORAL | Status: AC
  Filled 2016-06-08: qty 1

## 2016-06-08 MED ORDER — FENTANYL CITRATE (PF) 100 MCG/2ML IJ SOLN
INTRAMUSCULAR | Status: AC
  Filled 2016-06-08: qty 2

## 2016-06-08 MED ORDER — CEFAZOLIN SODIUM-DEXTROSE 2-4 GM/100ML-% IV SOLN
INTRAVENOUS | Status: AC
  Filled 2016-06-08: qty 100

## 2016-06-08 MED ORDER — KETOROLAC TROMETHAMINE 30 MG/ML IJ SOLN
INTRAMUSCULAR | Status: AC
  Filled 2016-06-08: qty 1

## 2016-06-08 SURGICAL SUPPLY — 40 items
BENZOIN TINCTURE PRP APPL 2/3 (GAUZE/BANDAGES/DRESSINGS) IMPLANT
BLADE SURG 10 STRL SS (BLADE) ×9 IMPLANT
CANISTER SUCT 3000ML PPV (MISCELLANEOUS) ×3 IMPLANT
CHLORAPREP W/TINT 26ML (MISCELLANEOUS) ×3 IMPLANT
CLOSURE WOUND 1/2 X4 (GAUZE/BANDAGES/DRESSINGS)
COVER LIGHT HANDLE  1/PK (MISCELLANEOUS) ×2
COVER LIGHT HANDLE 1/PK (MISCELLANEOUS) ×1 IMPLANT
DRESSING DISP NPWT PICO 4X12 (MISCELLANEOUS) ×3 IMPLANT
DRSG OPSITE POSTOP 4X10 (GAUZE/BANDAGES/DRESSINGS) IMPLANT
DRSG TELFA 3X8 NADH (GAUZE/BANDAGES/DRESSINGS) IMPLANT
ELECT CAUTERY BLADE 6.4 (BLADE) ×3 IMPLANT
ELECT REM PT RETURN 9FT ADLT (ELECTROSURGICAL) ×3
ELECTRODE REM PT RTRN 9FT ADLT (ELECTROSURGICAL) ×1 IMPLANT
GLOVE BIOGEL PI IND STRL 7.0 (GLOVE) ×2 IMPLANT
GLOVE BIOGEL PI IND STRL 7.5 (GLOVE) ×1 IMPLANT
GLOVE BIOGEL PI INDICATOR 7.0 (GLOVE) ×4
GLOVE BIOGEL PI INDICATOR 7.5 (GLOVE) ×2
GLOVE SURG SS PI 7.0 STRL IVOR (GLOVE) ×3 IMPLANT
GOWN STRL REUS W/ TWL LRG LVL3 (GOWN DISPOSABLE) ×2 IMPLANT
GOWN STRL REUS W/ TWL XL LVL3 (GOWN DISPOSABLE) ×1 IMPLANT
GOWN STRL REUS W/TWL LRG LVL3 (GOWN DISPOSABLE) ×4
GOWN STRL REUS W/TWL XL LVL3 (GOWN DISPOSABLE) ×2
LIQUID BAND (GAUZE/BANDAGES/DRESSINGS) IMPLANT
NEEDLE HYPO 22GX1.5 SAFETY (NEEDLE) ×3 IMPLANT
NEEDLE HYPO 25X5/8 SAFETYGLIDE (NEEDLE) ×3 IMPLANT
NS IRRIG 1000ML POUR BTL (IV SOLUTION) ×3 IMPLANT
PACK C SECTION WH (CUSTOM PROCEDURE TRAY) ×3 IMPLANT
PAD OB MATERNITY 4.3X12.25 (PERSONAL CARE ITEMS) ×3 IMPLANT
PAD PREP 24X48 CUFFED NSTRL (MISCELLANEOUS) ×3 IMPLANT
RETRACTOR TRAXI PANNICULUS (MISCELLANEOUS) ×1 IMPLANT
SPONGE LAP 18X18 X RAY DECT (DISPOSABLE) ×3 IMPLANT
STRIP CLOSURE SKIN 1/2X4 (GAUZE/BANDAGES/DRESSINGS) IMPLANT
SUT MON AB 4-0 PS1 27 (SUTURE) ×3 IMPLANT
SUT MON AB-0 CT1 36 (SUTURE) ×9 IMPLANT
SUT PLAIN 2 0 (SUTURE) ×4
SUT PLAIN ABS 2-0 CT1 27XMFL (SUTURE) ×2 IMPLANT
SUT VIC AB 0 CT1 36 (SUTURE) ×6 IMPLANT
SUT VIC AB 3-0 CT1 27 (SUTURE) ×2
SUT VIC AB 3-0 CT1 TAPERPNT 27 (SUTURE) ×1 IMPLANT
TRAXI PANNICULUS RETRACTOR (MISCELLANEOUS) ×2

## 2016-06-08 NOTE — Anesthesia Postprocedure Evaluation (Signed)
Anesthesia Post Note  Patient: [name] [name]  Procedure(s) Performed: Procedure(s) (LRB): CESAREAN SECTION (N/A)  Patient location during evaluation: PACU Anesthesia Type: Spinal and Epidural Level of consciousness: oriented and awake and alert Pain management: pain level controlled Vital Signs Assessment: post-procedure vital signs reviewed and stable Respiratory status: spontaneous breathing, respiratory function stable and patient connected to nasal cannula oxygen Cardiovascular status: blood pressure returned to baseline and stable Postop Assessment: no headache, no backache and patient able to bend at knees Anesthetic complications: no Comments: Patient with post op pain that is to be expected given that she cant tolerate intrathecal morphine due to side effect of severe, prolonged itching.. We have given 2.5mg  IV dilaudid, 1 0mg  oxycodone, toradol 30mg  in addition to antianxiety medication (likely strong component of psychological/emotional pain as well given severe reactions to "pressure" intraop during C section even with completely numb torso and immobile legs under spinal anesthesia)     Last Vitals:  Vitals:   06/08/16 1500 06/08/16 1530  BP: 126/90   Pulse: 65 72  Resp: 16   Temp:  36.5 C    Last Pain:  Vitals:   06/08/16 1530  TempSrc: Oral  PainSc: 7    Pain Goal: Patients Stated Pain Goal: 2 (06/06/16 2036)               Zenaida Deed
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
## 2016-06-08 NOTE — Progress Notes (Signed)
I checked in on pt prior to her C/Section.  She stated that she was nervous, but that she has been mentally preparing for this moment.  I asked if there was anything else that would help her or be meaningful to her prior to surgery and she stated no particular needs at this time.  She is looking forward to being on the other side of her surgery and meeting her baby.  [place], Bcc Pager, [phone] 10:33 AM    06/08/16 1000  Clinical Encounter Type  Visited With Patient  Visit Type Spiritual support

## 2016-06-08 NOTE — Anesthesia Procedure Notes (Signed)
Spinal  Patient location during procedure: OR Staffing Anesthesiologist: Jalayna Josten Performed: anesthesiologist  Preanesthetic Checklist Completed: patient identified, site marked, surgical consent, pre-op evaluation, timeout performed, IV checked, risks and benefits discussed and monitors and equipment checked Spinal Block Patient position: sitting Prep: DuraPrep Patient monitoring: heart rate, continuous pulse ox, blood pressure and cardiac monitor Location: L4-5 Injection technique: single-shot Needle Needle type: Pencan  Needle gauge: 24 G Needle length: 9 cm Assessment Sensory level: T6 Additional Notes Functioning IV was confirmed and monitors were applied. Sterile prep and drape, including hand hygiene, mask and sterile gloves were used. The patient was positioned and the spine was prepped. The skin was anesthetized with lidocaine.  Free flow of clear CSF was obtained prior to injecting local anesthetic into the CSF.  The spinal needle aspirated freely following injection.  The needle was carefully withdrawn.  The patient tolerated the procedure well. Consent was obtained prior to procedure with all questions answered and concerns addressed.  [name] Pink, MD

## 2016-06-08 NOTE — Transfer of Care (Addendum)
Immediate Anesthesia Transfer of Care Note  Patient: [name] [name]  Procedure(s) Performed: Procedure(s): CESAREAN SECTION (N/A)  Patient Location: PACU  Anesthesia Type:Spinal  Level of Consciousness: awake, alert , oriented and patient cooperative  Airway & Oxygen Therapy: Patient Spontanous Breathing  Post-op Assessment: Report given to RN and Post -op Vital signs reviewed and stable  Post vital signs: Reviewed and stable  Last Vitals:  TEMP 97.4 BP 123/93 HR 75 RR 16 POX 100  Last Pain:     Pain level patient stated 7; additional dilaudid 1 mg Pain level after dosing 3 Patients Stated Pain Goal: 2 ([id number])  Complications: No apparent anesthesia complications

## 2016-06-08 NOTE — Op Note (Addendum)
Operative Note   SURGERY DATE: 06/08/2016  PRE-OP DIAGNOSIS:  *Intrauterine pregnancy @ 34/0 weeks *Pre-eclampsia with severe features (neurological signs and symptoms, blood pressures) *History of cesarean x 3 *Late prenatal care  POST-OP DIAGNOSIS: Same. Delivered   PROCEDURE: Repeat low transverse cesarean section via pfannenstiel skin incision with double layer uterine closure  SURGEON: Surgeon(s) and Role:    * Aletha Halim, MD - Primary  ASSISTANT: Katherine Basset, DO  ANESTHESIA: CSE  ESTIMATED BLOOD LOSS: 617mL  DRAINS: 233mL UOP via indwelling foley  TOTAL IV FLUIDS: 1332mL crystalloid  VTE Prophylaxis: SCDs to bilateral lower extremities  ANTIBIOTICS: Two grams of Cefazolin were given., within 1 hour of skin incision  SPECIMENS: placenta to pathology, cord gases  COMPLICATIONS: None  INDICATIONS: 34wks and pre-eclampsia with severe features.   FINDINGS: Surprisingly, no intra-abdominal adhesions were noted upon entry into the abdomen, but this could've been due to going a few centimeters above the prior c-section low transverse incisions. Making a bladder flap seemed as if scar tissue would've been encountered so one was not done Grossly normal uterus, tubes and ovaries. Clear amniotic fluid, cephalic female infant, weight 1770gm, APGARs 8/8, intact placenta.  Results for [name] [name], [name] [name] (MRN [id number]) as of 06/08/2016 21:10  Ref. Range 06/08/2016 13:10 06/08/2016 13:12  pH cord blood Unknown 7.362 7.306  pCO2 cord blood Latest Units: mmHg 41.4 52.5  pO2 cord blood Latest Units: mmHg 35.6   Bicarbonate Latest Ref Range: 20.0 - 24.0 mEq/L 22.9 25.4 (H)  TCO2 Latest Ref Range: 0 - 100 mmol/L 24.2 27.0  Acid-base deficit Latest Ref Range: 0.0 - 2.0 mmol/L 1.9 1.3    PROCEDURE IN DETAIL: The patient was taken to the operating room where anesthesia was administered and normal fetal heart tones were confirmed. She was then prepped and draped in the normal  fashion in the dorsal supine position with a leftward tilt.  After a time out was performed, a pfannensteil skin incision was made with the scalpel and carried through to the underlying layer of fascia, approximately 2-3cm above the old skin incision. The fascia was then incised at the midline and this incision was extended laterally with the mayo scissors. Attention was turned to the superior aspect of the fascial incision which was grasped with the kocher clamps x 2, tented up and the rectus muscles were dissected off with the scalpel. In a similar fashion the inferior aspect of the fascial incision was grasped with the kocher clamps, tented up and the rectus muscles dissected off with the mayo scissors. The rectus muscles were then separated in the midline and the peritoneum was entered bluntly. The bladder blade was inserted.  A low transverse hysterotomy was made with the scalpel until the endometrial cavity was breached and the amniotic sac ruptured with the Allis clamp, yielding clear amniotic fluid. This incision was extended bluntly and the infant's head, shoulders and body were delivered atraumatically.The cord was clamped x 2 and cut, and the infant was handed to the awaiting pediatricians, after delayed cord clamping was done for one minute.  The placenta was then gradually expressed from the uterus and then the uterus was exteriorized and cleared of all clots and debris. The hysterotomy was repaired with a running suture of 1-0 monocryl. A second imbricating layer of 1-0 monocryl suture was then placed. Several figure-of-eight sutures of 1-0 monocryl were added to achieve excellent hemostasis.   The uterus and adnexa were then returned to the abdomen, and the hysterotomy and all  operative sites were reinspected and excellent hemostasis was noted after irrigation and suction of the abdomen with warm saline.  The fascia was reapproximated with 0 vicryl in a simple running fashion in a bilateral  fashion. The subcutaneous layer was then reapproximated with interrupted sutures of 2-0 plain gut, and the skin was then closed with 4-0 monocryl, in a subcuticular fashion.  The patient  tolerated the procedure well. Sponge, lap, needle, and instrument counts were correct x 2. The patient was transferred to the recovery room awake, alert and breathing independently in stable condition.  Prior to surgery, the patient declined a BTL.   She will be started on ppMg in the PACU for 24 hours.   Durene Romans MD Attending Center for [facility])

## 2016-06-08 NOTE — Anesthesia Preprocedure Evaluation (Addendum)
Anesthesia Evaluation  Patient identified by MRN, date of birth, ID band Patient awake    Reviewed: Allergy & Precautions, H&P , NPO status , Patient's Chart, lab work & pertinent test results  Airway Mallampati: II  TM Distance: >3 FB Neck ROM: full    Dental no notable dental hx.    Pulmonary Current Smoker,    Pulmonary exam normal breath sounds clear to auscultation       Cardiovascular negative cardio ROS Normal cardiovascular exam Rhythm:regular Rate:Normal     Neuro/Psych Anxiety Depression  Neuromuscular disease    GI/Hepatic negative GI ROS, Neg liver ROS,   Endo/Other  negative endocrine ROS  Renal/GU negative Renal ROS  negative genitourinary   Musculoskeletal   Abdominal   Peds  Hematology negative hematology ROS (+)   Anesthesia Other Findings Pre-eclampsia with severe features, HTN and HA  This is her 4th C section, she does report baseline back pain that comes and goes at times even when she is not pregnant, no issues previously with epidural.. Denies back surgery or blood thinning medications, denies asthma  Reproductive/Obstetrics (+) Pregnancy                            Anesthesia Physical Anesthesia Plan  ASA: III  Anesthesia Plan: Spinal and Epidural   Post-op Pain Management:    Induction: Intravenous  Airway Management Planned:   Additional Equipment:   Intra-op Plan:   Post-operative Plan:   Informed Consent: I have reviewed the patients History and Physical, chart, labs and discussed the procedure including the risks, benefits and alternatives for the proposed anesthesia with the patient or authorized representative who has indicated his/her understanding and acceptance.     Plan Discussed with:   Anesthesia Plan Comments: (CSE planned for OR)        Anesthesia Quick Evaluation

## 2016-06-09 ENCOUNTER — Other Ambulatory Visit: Payer: Self-pay | Admitting: Family

## 2016-06-09 ENCOUNTER — Encounter (HOSPITAL_COMMUNITY): Payer: Self-pay | Admitting: Obstetrics and Gynecology

## 2016-06-09 LAB — COMPREHENSIVE METABOLIC PANEL
ALT: 12 U/L — AB (ref 14–54)
AST: 12 U/L — AB (ref 15–41)
Albumin: 2.3 g/dL — ABNORMAL LOW (ref 3.5–5.0)
Alkaline Phosphatase: 90 U/L (ref 38–126)
Anion gap: 6 (ref 5–15)
BILIRUBIN TOTAL: 0.2 mg/dL — AB (ref 0.3–1.2)
BUN: 10 mg/dL (ref 6–20)
CALCIUM: 7 mg/dL — AB (ref 8.9–10.3)
CHLORIDE: 103 mmol/L (ref 101–111)
CO2: 24 mmol/L (ref 22–32)
CREATININE: 0.49 mg/dL (ref 0.44–1.00)
Glucose, Bld: 117 mg/dL — ABNORMAL HIGH (ref 65–99)
Potassium: 4.1 mmol/L (ref 3.5–5.1)
Sodium: 133 mmol/L — ABNORMAL LOW (ref 135–145)
TOTAL PROTEIN: 5.1 g/dL — AB (ref 6.5–8.1)

## 2016-06-09 LAB — CBC
HCT: 26.2 % — ABNORMAL LOW (ref 36.0–46.0)
HEMOGLOBIN: 8.9 g/dL — AB (ref 12.0–15.0)
MCH: 30.3 pg (ref 26.0–34.0)
MCHC: 34 g/dL (ref 30.0–36.0)
MCV: 89.1 fL (ref 78.0–100.0)
Platelets: 328 10*3/uL (ref 150–400)
RBC: 2.94 MIL/uL — ABNORMAL LOW (ref 3.87–5.11)
RDW: 14.1 % (ref 11.5–15.5)
WBC: 17.7 10*3/uL — AB (ref 4.0–10.5)

## 2016-06-09 MED ORDER — OXYCODONE-ACETAMINOPHEN 5-325 MG PO TABS
1.0000 | ORAL_TABLET | Freq: Four times a day (QID) | ORAL | Status: DC | PRN
  Administered 2016-06-09 (×3): 2 via ORAL
  Filled 2016-06-09 (×3): qty 2

## 2016-06-09 MED ORDER — ALPRAZOLAM 0.5 MG PO TABS
0.5000 mg | ORAL_TABLET | Freq: Once | ORAL | Status: AC
  Administered 2016-06-09: 0.5 mg via ORAL
  Filled 2016-06-09: qty 1

## 2016-06-09 MED ORDER — OXYCODONE-ACETAMINOPHEN 5-325 MG PO TABS
1.0000 | ORAL_TABLET | ORAL | Status: DC | PRN
  Administered 2016-06-09 – 2016-06-11 (×7): 2 via ORAL
  Filled 2016-06-09 (×7): qty 2

## 2016-06-09 NOTE — Progress Notes (Signed)
Subjective: Postpartum Day 1: Cesarean Delivery , #4, for PreE with severe features (h/a) Patient reports incisional pain and tolerating PO.  Headache is gone. Has PICO in place Has many ?s re BC, as FOB talked her out of BTL, "maybe another one in 5 yrs." Concerned about bleeding or pain with IUD,  Objective: Vital signs in last 24 hours: Temp:  [96.4 F (35.8 C)-99.2 F (37.3 C)] 97.7 F (36.5 C) (07/27 0011) Pulse Rate:  [65-105] 83 (07/27 0226) Resp:  [16-22] 18 (07/27 0226) BP: (116-138)/(74-94) 126/76 (07/27 0226) SpO2:  [98 %-100 %] 98 % (07/26 1557) Weight:  [97.8 kg (215 lb 8 oz)] 97.8 kg (215 lb 8 oz) (07/26 0802)  Physical Exam:  General: alert, cooperative and no distress Lochia: appropriate Uterine Fundus: firm Incision: no significant drainage, no dehiscence, Pico in place, dressing dry abd soft BS+  DVT Evaluation: No evidence of DVT seen on physical exam.   Recent Labs  06/06/16 0611 06/08/16 0550  HGB 11.3* 11.4*  HCT 32.6* 33.6*    Assessment/Plan: Status post Cesarean section. Postoperative course complicated by Mag sulfate til 2 pm  D/c mag at 2 pm, probable transfer to 3rd floor CBC cmp pending D/c home Saturday.  [name] [name] V 06/09/2016, 6:02 AM

## 2016-06-09 NOTE — Lactation Note (Signed)
This note was copied from a baby's chart. Lactation Consultation Note  Patient Name: [name] [name] [id number] Date: 06/09/2016 Reason for consult: Initial assessment;NICU baby Initial visit made.  Breastfeeding consultation services and support information given to patient.Marland KitchenMarland KitchenProviding Breastmilk For Your Baby in NICU given and reviewed.  Mom states she is obtaining a few mls.  Instructed to continue pumping every 3 hours and milk volume should increase between day 3-5.  Mom is transferring her [place] to [place] from [place].  No questions at present.  Encouraged to call prn.  Maternal Data Does the patient have breastfeeding experience prior to this delivery?: Yes  Feeding Feeding Type: Formula Nipple Type: Slow - flow Length of feed: 10 min  LATCH Score/Interventions                      Lactation Tools Discussed/Used WIC Program: Yes Pump Review: Setup, frequency, and cleaning;Milk Storage Initiated by:: RN Date initiated:: 06/09/16   Consult Status Consult Status: Follow-up Date: 06/10/16 Follow-up type: In-patient    Ave Filter 06/09/2016, 11:41 AM

## 2016-06-10 MED ORDER — HYDROCHLOROTHIAZIDE 25 MG PO TABS
25.0000 mg | ORAL_TABLET | Freq: Every day | ORAL | Status: DC
  Administered 2016-06-10: 25 mg via ORAL
  Filled 2016-06-10 (×3): qty 1

## 2016-06-10 NOTE — Lactation Note (Signed)
This note was copied from a baby's chart. Lactation Consultation Note  Patient Name: [name] [name] [id number] Date: 06/10/2016  Mom states she pumped four times yesterday.  She is obtaining a few mls.  Encouraged to pump 8 times in 24 hours and to call with concerns.   Maternal Data    Feeding Feeding Type: Formula  LATCH Score/Interventions                      Lactation Tools Discussed/Used     Consult Status      Ave Filter 06/10/2016, 11:24 AM

## 2016-06-10 NOTE — Progress Notes (Signed)
Patient ID: [name] [name], female   DOB: [birth year], 25 y.o.   MRN: [id number] Subjective: Postpartum Day 2: Cesarean Delivery , #4, for PreE with severe features (h/a) Patient reports incisional pain and tolerating PO.  Headache is gone. She denies any visual disturbances, RUQ/epigastric pain.    Vital signs in last 24 hours: Temp:  [98.1 F (36.7 C)-98.8 F (37.1 C)] 98.5 F (36.9 C) (07/27 2115) Pulse Rate:  [80-92] 92 (07/27 2115) Resp:  [16-18] 18 (07/27 2230) BP: (136-152)/(78-102) 144/93 (07/27 2115) SpO2:  [98 %] 98 % (07/27 2115)  Physical Exam:  General: alert, cooperative and no distress Lochia: appropriate Uterine Fundus: firm Incision: no significant drainage, no dehiscence, Pico in place, dressing dry abd soft BS+  DVT Evaluation: No evidence of DVT seen on physical exam.   Recent Labs  06/08/16 0550 06/09/16 0556  HGB 11.4* 8.9*  HCT 33.6* 26.2*    Assessment/Plan: Status post Cesarean section. Doing well postoperatively.  Will start HCTZ  Continue close monitoring D/C planning tomorrow  [name] [name] 06/10/2016, 7:39 AM
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
## 2016-06-11 MED ORDER — ENALAPRIL-HYDROCHLOROTHIAZIDE 10-25 MG PO TABS: 1.0000 | ORAL_TABLET | Freq: Every day | ORAL | 3 refills | Status: DC

## 2016-06-11 MED ORDER — OXYCODONE-ACETAMINOPHEN 5-325 MG PO TABS: 1.0000 | ORAL_TABLET | Freq: Four times a day (QID) | ORAL | 0 refills | Status: DC | PRN

## 2016-06-11 MED ORDER — ENALAPRIL MALEATE 10 MG PO TABS
10.0000 mg | ORAL_TABLET | Freq: Every day | ORAL | Status: DC
  Administered 2016-06-11: 10 mg via ORAL
  Filled 2016-06-11 (×2): qty 1

## 2016-06-11 MED ORDER — DOCUSATE SODIUM 100 MG PO CAPS: 100.0000 mg | ORAL_CAPSULE | Freq: Two times a day (BID) | ORAL | 2 refills | Status: DC | PRN

## 2016-06-11 MED ORDER — AMLODIPINE BESYLATE 10 MG PO TABS: 10.0000 mg | ORAL_TABLET | Freq: Every day | ORAL | Status: DC

## 2016-06-11 NOTE — Discharge Summary (Signed)
OB Discharge Summary     Patient Name: [name] [name] DOB: May 25, 1991 MRN: [id number]  Date of admission: 06/01/2016 Delivering MD: Aletha Halim   Date of discharge: 06/11/2016  Admitting diagnosis: INCREASED BP Intrauterine pregnancy: [redacted]w[redacted]d     Secondary diagnosis:  Principal Problem:   Pre-eclampsia, severe, antepartum Active Problems:   S/P repeat cesarean section  Additional problems: None     Discharge diagnosis: Term Pregnancy Delivered and Preeclampsia (severe)                                                                                                Post partum procedures: Magnesium sulfate  Complications: None  Hospital course:   Brief Hospital Course: 25 y.o. is a [id number] who was admitted 06/01/2016 at [redacted]w[redacted]d with preeclampsia.  She was admitted to antenatal unit and started on betamethasone (2 doses, 24 hours apart) and closely monitored.  She reported headaches, had negative MRI evaluation. Headaches persisted and she underwent repeat cesarean section at [redacted]w[redacted]d.  Membrane Rupture Time/Date: 1:04 PM ,06/08/2016   Patient delivered a Viable infant.06/08/2016  Details of operation can be found in separate operative note.  She received magnesium sulfate for 24 hours postpartum. BP controlled on HCTZ and Enalapril was also added to her regimen.   Patient had an otherwise uncomplicated postpartum course.  By time of discharge on POD#2, her pain was controlled on oral pain medications; she had appropriate lochia and was ambulating, voiding without difficulty, tolerating regular diet and passing flatus.  She is breastfeeding and will use IUD for postpartum contraception.  She was deemed stable for discharge to home.   She will remove PICO dressing a week after surgery.  Physical exam  Vitals:   06/10/16 1926 06/10/16 2003 06/11/16 0545 06/11/16 0741  BP: (!) 142/104 140/89 (!) 141/94   Pulse: (!) 121 (!) 103 87   Resp: 16 18 18 20   Temp: 97.8 F (36.6 C) 98 F  (36.7 C) 97.8 F (36.6 C)   TempSrc: Oral Oral Oral   SpO2:      Weight:      Height:       General appearance: Alert and no distress Resp: Clear to auscultation bilaterally Cardio: Regular rate and rhythm Abdomen: Fundus below umbilicus, appropriate lochia. NT abdomen. PICO dressing is in place. Extremities: Extremities normal, atraumatic, no cyanosis or edema and Homans sign is negative, no sign of DVT Pulses: 2+ and symmetric Neurologic: Alert and oriented X 3, normal strength and tone. Normal symmetric reflexes. Normal coordination and gait  Labs: Lab Results  Component Value Date   WBC 17.7 (H) 06/09/2016   HGB 8.9 (L) 06/09/2016   HCT 26.2 (L) 06/09/2016   MCV 89.1 06/09/2016   PLT 328 06/09/2016   CMP Latest Ref Rng & Units 06/09/2016  Glucose 65 - 99 mg/dL 117(H)  BUN 6 - 20 mg/dL 10  Creatinine 0.44 - 1.00 mg/dL 0.49  Sodium 135 - 145 mmol/L 133(L)  Potassium 3.5 - 5.1 mmol/L 4.1  Chloride 101 - 111 mmol/L 103  CO2 22 - 32 mmol/L 24  Calcium 8.9 -  10.3 mg/dL 7.0(L)  Total Protein 6.5 - 8.1 g/dL 5.1(L)  Total Bilirubin 0.3 - 1.2 mg/dL 0.2(L)  Alkaline Phos 38 - 126 U/L 90  AST 15 - 41 U/L 12(L)  ALT 14 - 54 U/L 12(L)    Discharge instruction: per After Visit Summary and "Baby and Me Booklet".  After visit meds:    Medication List    STOP taking these medications   acetaminophen 500 MG tablet Commonly known as:  TYLENOL   aspirin EC 81 MG tablet   calcium carbonate 500 MG chewable tablet Commonly known as:  TUMS - dosed in mg elemental calcium   labetalol 100 MG tablet Commonly known as:  NORMODYNE   metoCLOPramide 10 MG tablet Commonly known as:  REGLAN     TAKE these medications   docusate sodium 100 MG capsule Commonly known as:  COLACE Take 1 capsule (100 mg total) by mouth 2 (two) times daily as needed.   enalapril-hydrochlorothiazide 10-25 MG tablet Commonly known as:  VASERETIC Take 1 tablet by mouth daily.   multivitamin-prenatal  27-0.8 MG Tabs tablet Take 1 tablet by mouth daily at 12 noon.   oxyCODONE-acetaminophen 5-325 MG tablet Commonly known as:  PERCOCET/ROXICET Take 1-2 tablets by mouth every 6 (six) hours as needed for moderate pain. Use only if Reglan does not work What changed:  how much to take   SYSTANE BALANCE 0.6 % Soln Generic drug:  Propylene Glycol Place 2 drops into both eyes daily as needed (dryness). Reported on 06/01/2016       Diet: routine diet  Activity: Advance as tolerated. Pelvic rest for 6 weeks.   Outpatient follow up:6 weeks Follow up Appt: Future Appointments Date Time Provider [place]  06/20/2016 1:30 PM [facility] [place]  06/27/2016 10:00 AM [facility]   Follow up Visit:No Follow-up on file.  Postpartum contraception: IUD  Newborn Data: Live born female  Birth Weight: 3 lb 14.4 oz (1770 g) APGAR: 8, 8  Baby Feeding: Breast Disposition:home with mother   06/11/2016 Osborne Oman, MD

## 2016-06-11 NOTE — Discharge Instructions (Signed)
Cesarean Delivery, Care After Refer to this sheet in the next few weeks. These instructions provide you with information on caring for yourself after your procedure. Your health care provider may also give you specific instructions. Your treatment has been planned according to current medical practices, but problems sometimes occur. Call your health care provider if you have any problems or questions after you go home. HOME CARE INSTRUCTIONS  Only take over-the-counter or prescription medications as directed by your health care provider.  Do not drink alcohol, especially if you are breastfeeding or taking medication to relieve pain.  Do not chew or smoke tobacco.  Continue to use good perineal care. Good perineal care includes:  Wiping your perineum from front to back.  Keeping your perineum clean.  Check your surgical cut (incision) daily for increased redness, drainage, swelling, or separation of skin.  Clean your incision gently with soap and water every day, and then pat it dry. If your health care provider says it is okay, leave the incision uncovered. Use a bandage (dressing) if the incision is draining fluid or appears irritated. If the adhesive strips across the incision do not fall off within 7 days, carefully peel them off.  Hug a pillow when coughing or sneezing until your incision is healed. This helps to relieve pain.  Do not use tampons or douche until your health care provider says it is okay.  Shower, wash your hair, and take tub baths as directed by your health care provider.  Wear a well-fitting bra that provides breast support.  Limit wearing support panties or control-top hose.  Drink enough fluids to keep your urine clear or pale yellow.  Eat high-fiber foods such as whole grain cereals and breads, brown rice, beans, and fresh fruits and vegetables every day. These foods may help prevent or relieve constipation.  Resume activities such as climbing stairs,  driving, lifting, exercising, or traveling as directed by your health care provider.  Talk to your health care provider about resuming sexual activities. This is dependent upon your risk of infection, your rate of healing, and your comfort and desire to resume sexual activity.  Try to have someone help you with your household activities and your newborn for at least a few days after you leave the hospital.  Rest as much as possible. Try to rest or take a nap when your newborn is sleeping.  Increase your activities gradually.  Keep all of your scheduled postpartum appointments. It is very important to keep your scheduled follow-up appointments. At these appointments, your health care provider will be checking to make sure that you are healing physically and emotionally. SEEK MEDICAL CARE IF:   You are passing large clots from your vagina. Save any clots to show your health care provider.  You have a foul smelling discharge from your vagina.  You have trouble urinating.  You are urinating frequently.  You have pain when you urinate.  You have a change in your bowel movements.  You have increasing redness, pain, or swelling near your incision.  You have pus draining from your incision.  Your incision is separating.  You have painful, hard, or reddened breasts.  You have a severe headache.  You have blurred vision or see spots.  You feel sad or depressed.  You have thoughts of hurting yourself or your newborn.  You have questions about your care, the care of your newborn, or medications.  You are dizzy or light-headed.  You have a rash.  You  have pain, redness, or swelling at the site of the removed intravenous access (IV) tube.  You have nausea or vomiting.  You stopped breastfeeding and have not had a menstrual period within 12 weeks of stopping.  You are not breastfeeding and have not had a menstrual period within 12 weeks of delivery.  You have a fever. SEEK  IMMEDIATE MEDICAL CARE IF:  You have persistent pain.  You have chest pain.  You have shortness of breath.  You faint.  You have leg pain.  You have stomach pain.  Your vaginal bleeding saturates 2 or more sanitary pads in 1 hour. MAKE SURE YOU:   Understand these instructions.  Will watch your condition.  Will get help right away if you are not doing well or get worse.   This information is not intended to replace advice given to you by your health care provider. Make sure you discuss any questions you have with your health care provider.   Document Released: 07/23/2002 Document Revised: 11/21/2014 Document Reviewed: 06/27/2012 Elsevier Interactive Patient Education 2016 Elsevier Inc.   Preeclampsia and Eclampsia Preeclampsia is a serious condition that develops only during pregnancy. It is also called toxemia of pregnancy. This condition causes high blood pressure along with other symptoms, such as swelling and headaches. These may develop as the condition gets worse. Preeclampsia may occur 20 weeks or later into your pregnancy.  Diagnosing and treating preeclampsia early is very important. If not treated early, it can cause serious problems for you and your baby. One problem it can lead to is eclampsia, which is a condition that causes muscle jerking or shaking (convulsions) in the mother. Delivering your baby is the best treatment for preeclampsia or eclampsia.  RISK FACTORS The cause of preeclampsia is not known. You may be more likely to develop preeclampsia if you have certain risk factors. These include:   Being pregnant for the first time.  Having preeclampsia in a past pregnancy.  Having a family history of preeclampsia.  Having high blood pressure.  Being pregnant with twins or triplets.  Being 24 or older.  Being African American.  Having kidney disease or diabetes.  Having medical conditions such as lupus or blood diseases.  Being very overweight  (obese). SIGNS AND SYMPTOMS  The earliest signs of preeclampsia are:  High blood pressure.  Increased protein in your urine. Your health care provider will check for this at every prenatal visit. Other symptoms that can develop include:   Severe headaches.  Sudden weight gain.  Swelling of your hands, face, legs, and feet.  Feeling sick to your stomach (nauseous) and throwing up (vomiting).  Vision problems (blurred or double vision).  Numbness in your face, arms, legs, and feet.  Dizziness.  Slurred speech.  Sensitivity to bright lights.  Abdominal pain. DIAGNOSIS  There are no screening tests for preeclampsia. Your health care provider will ask you about symptoms and check for signs of preeclampsia during your prenatal visits. You may also have tests, including:  Urine testing.  Blood testing.  Checking your baby's heart rate.  Checking the health of your baby and your placenta using images created with sound waves (ultrasound). TREATMENT  You can work out the best treatment approach together with your health care provider. It is very important to keep all prenatal appointments. If you have an increased risk of preeclampsia, you may need more frequent prenatal exams.  Your health care provider may prescribe bed rest.  You may have to eat as  little salt as possible.  You may need to take medicine to lower your blood pressure if the condition does not respond to more conservative measures.  You may need to stay in the hospital if your condition is severe. There, treatment will focus on controlling your blood pressure and fluid retention. You may also need to take medicine to prevent seizures.  If the condition gets worse, your baby may need to be delivered early to protect you and the baby. You may have your labor started with medicine (be induced), or you may have a cesarean delivery.  Preeclampsia usually goes away after the baby is born. HOME CARE INSTRUCTIONS     Only take over-the-counter or prescription medicines as directed by your health care provider.  Lie on your left side while resting. This keeps pressure off your baby.  Elevate your feet while resting.  Get regular exercise. Ask your health care provider what type of exercise is safe for you.  Avoid caffeine and alcohol.  Do not smoke.  Drink 6-8 glasses of water every day.  Eat a balanced diet that is low in salt. Do not add salt to your food.  Avoid stressful situations as much as possible.  Get plenty of rest and sleep.  Keep all prenatal appointments and tests as scheduled. SEEK MEDICAL CARE IF:  You are gaining more weight than expected.  You have any headaches, abdominal pain, or nausea.  You are bruising more than usual.  You feel dizzy or light-headed. SEEK IMMEDIATE MEDICAL CARE IF:   You develop sudden or severe swelling anywhere in your body. This usually happens in the legs.  You gain 5 lb (2.3 kg) or more in a week.  You have a severe headache, dizziness, problems with your vision, or confusion.  You have severe abdominal pain.  You have lasting nausea or vomiting.  You have a seizure.  You have trouble moving any part of your body.  You develop numbness in your body.  You have trouble speaking.  You have any abnormal bleeding.  You develop a stiff neck.  You pass out. MAKE SURE YOU:   Understand these instructions.  Will watch your condition.  Will get help right away if you are not doing well or get worse.   This information is not intended to replace advice given to you by your health care provider. Make sure you discuss any questions you have with your health care provider.   Document Released: 10/28/2000 Document Revised: 11/05/2013 Document Reviewed: 08/23/2013 Elsevier Interactive Patient Education [employer].

## 2016-06-13 ENCOUNTER — Ambulatory Visit: Payer: Self-pay

## 2016-06-13 NOTE — Lactation Note (Signed)
This note was copied from a baby's chart. Lactation Consultation Note  Patient Name: [name] [name] [id number] Date: 06/13/2016 Reason for consult: Follow-up assessment Baby at 5 days of life. Mom has questions about pumping. She reports using Medela DEBP that she rented from the hospital x3 today. She pumps for 30 minutes each time and got a total of 77ml for all 3 pumpings today. Encouraged mom to pump 8-12 times every 24 hr. Reviewed hands on pumping and using the let down button on the pump. Take pictures and videos of the baby to watch at home while she is pumping. She can pump while she is visiting baby. Do sts and latch baby when baby is ready to tolerate those activities. If supply does not improve with increased hands on pumping, contact lactation.    Maternal Data    Feeding Feeding Type: Formula Length of feed: 60 min  LATCH Score/Interventions                      Lactation Tools Discussed/Used     Consult Status Consult Status: PRN    Denzil Hughes 06/13/2016, 5:10 PM

## 2016-06-14 ENCOUNTER — Ambulatory Visit: Payer: Self-pay

## 2016-06-14 NOTE — Lactation Note (Signed)
This note was copied from a baby's chart. Lactation Consultation Note  Patient Name: [name] [id number] Date: 06/14/2016 Reason for consult: Follow-up assessment;NICU baby Assisted mom with first feeding attempt at breast.  Baby is 89 days old and 34.6 weeks.  Discussed with mom normal preterm feeding.  Talked about expecting inconsistency with breastfeeding but as baby reaches term progress is usually seen.  Mom states she really wants to breastfeed and enjoyed nursing her previous babies.  Mom states she is pumping every 3 hours but only obtaining very small amounts.  Stressed importance of frequent pumping and relaxing during sessions.  Milk easily hand expressed prior to latch attempt.  Baby could latch for 3-4 sucks but would then slip off.  Reassured mom we would follow.  Maternal Data    Feeding Feeding Type: Breast Fed Nipple Type: Slow - flow Length of feed: 60 min  LATCH Score/Interventions Latch: Repeated attempts needed to sustain latch, nipple held in mouth throughout feeding, stimulation needed to elicit sucking reflex.  Audible Swallowing: None  Type of Nipple: Everted at rest and after stimulation  Comfort (Breast/Nipple): Soft / non-tender     Hold (Positioning): Assistance needed to correctly position infant at breast and maintain latch. Intervention(s): Breastfeeding basics reviewed;Support Pillows;Position options  LATCH Score: 6  Lactation Tools Discussed/Used     Consult Status      Ave Filter 06/14/2016, 6:12 PM

## 2016-06-20 ENCOUNTER — Ambulatory Visit: Payer: Self-pay

## 2016-06-27 ENCOUNTER — Ambulatory Visit: Payer: Self-pay

## 2016-06-29 ENCOUNTER — Encounter: Payer: Self-pay | Admitting: *Deleted

## 2016-07-25 ENCOUNTER — Ambulatory Visit: Payer: Self-pay | Admitting: Obstetrics and Gynecology

## 2016-07-26 ENCOUNTER — Encounter: Payer: Self-pay | Admitting: Obstetrics and Gynecology

## 2016-07-26 NOTE — Progress Notes (Signed)
Patient did not keep 07/25/2016 postpartum visit  Aletha Halim, Brooke Bonito MD Attending Center for [place] [facility])

## 2016-08-29 NOTE — Telephone Encounter (Signed)
Opened in error

## 2016-12-29 ENCOUNTER — Emergency Department (HOSPITAL_COMMUNITY)
Admission: EM | Admit: 2016-12-29 | Discharge: 2016-12-29 | Disposition: A | Discharge status: Home / Self Care | Payer: Medicaid Other | Attending: Emergency Medicine | Admitting: Emergency Medicine

## 2016-12-29 ENCOUNTER — Encounter (HOSPITAL_COMMUNITY): Payer: Self-pay | Admitting: Emergency Medicine

## 2016-12-29 ENCOUNTER — Emergency Department (HOSPITAL_COMMUNITY): Payer: Medicaid Other

## 2016-12-29 DIAGNOSIS — S20212A Contusion of left front wall of thorax, initial encounter: Secondary | ICD-10-CM | POA: Diagnosis not present

## 2016-12-29 DIAGNOSIS — O0281 Inappropriate change in quantitative human chorionic gonadotropin (hCG) in early pregnancy: Secondary | ICD-10-CM | POA: Insufficient documentation

## 2016-12-29 DIAGNOSIS — S5011XA Contusion of right forearm, initial encounter: Secondary | ICD-10-CM | POA: Insufficient documentation

## 2016-12-29 DIAGNOSIS — F17210 Nicotine dependence, cigarettes, uncomplicated: Secondary | ICD-10-CM | POA: Insufficient documentation

## 2016-12-29 DIAGNOSIS — S0990XA Unspecified injury of head, initial encounter: Secondary | ICD-10-CM

## 2016-12-29 DIAGNOSIS — Y939 Activity, unspecified: Secondary | ICD-10-CM | POA: Diagnosis not present

## 2016-12-29 DIAGNOSIS — Z3A08 8 weeks gestation of pregnancy: Secondary | ICD-10-CM | POA: Insufficient documentation

## 2016-12-29 DIAGNOSIS — S0101XA Laceration without foreign body of scalp, initial encounter: Secondary | ICD-10-CM | POA: Insufficient documentation

## 2016-12-29 DIAGNOSIS — O99331 Smoking (tobacco) complicating pregnancy, first trimester: Secondary | ICD-10-CM | POA: Insufficient documentation

## 2016-12-29 DIAGNOSIS — T07XXXA Unspecified multiple injuries, initial encounter: Secondary | ICD-10-CM

## 2016-12-29 DIAGNOSIS — Y999 Unspecified external cause status: Secondary | ICD-10-CM | POA: Insufficient documentation

## 2016-12-29 DIAGNOSIS — S7011XA Contusion of right thigh, initial encounter: Secondary | ICD-10-CM | POA: Diagnosis not present

## 2016-12-29 DIAGNOSIS — Y929 Unspecified place or not applicable: Secondary | ICD-10-CM | POA: Insufficient documentation

## 2016-12-29 DIAGNOSIS — O9A311 Physical abuse complicating pregnancy, first trimester: Secondary | ICD-10-CM | POA: Insufficient documentation

## 2016-12-29 LAB — BASIC METABOLIC PANEL
Anion gap: 10 (ref 5–15)
BUN: 10 mg/dL (ref 6–20)
CO2: 21 mmol/L — ABNORMAL LOW (ref 22–32)
CREATININE: 0.56 mg/dL (ref 0.44–1.00)
Calcium: 8.7 mg/dL — ABNORMAL LOW (ref 8.9–10.3)
Chloride: 106 mmol/L (ref 101–111)
GFR calc Af Amer: >60 mL/min (ref >60)
GLUCOSE: 82 mg/dL (ref 65–99)
POTASSIUM: 3.9 mmol/L (ref 3.5–5.1)
SODIUM: 137 mmol/L (ref 135–145)

## 2016-12-29 LAB — CBC
HCT: 37.1 % (ref 36.0–46.0)
Hemoglobin: 12.5 g/dL (ref 12.0–15.0)
MCH: 28.7 pg (ref 26.0–34.0)
MCHC: 33.7 g/dL (ref 30.0–36.0)
MCV: 85.1 fL (ref 78.0–100.0)
PLATELETS: 320 10*3/uL (ref 150–400)
RBC: 4.36 MIL/uL (ref 3.87–5.11)
RDW: 14.8 % (ref 11.5–15.5)
WBC: 13.9 10*3/uL — ABNORMAL HIGH (ref 4.0–10.5)

## 2016-12-29 LAB — HCG, QUANTITATIVE, PREGNANCY: HCG, BETA CHAIN, QUANT, S: 42639 m[IU]/mL — AB (ref <5)

## 2016-12-29 MED ORDER — OXYCODONE-ACETAMINOPHEN 5-325 MG PO TABS
2.0000 | ORAL_TABLET | Freq: Once | ORAL | Status: AC
  Administered 2016-12-29: 2 via ORAL
  Filled 2016-12-29: qty 2

## 2016-12-29 MED ORDER — HYDROCODONE-ACETAMINOPHEN 5-325 MG PO TABS: 1.0000 | ORAL_TABLET | ORAL | 0 refills | Status: DC | PRN
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
## 2016-12-29 NOTE — ED Notes (Signed)
Patient transported to CT 
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
## 2018-02-27 ENCOUNTER — Encounter: Payer: Self-pay | Admitting: *Deleted

## 2019-05-21 ENCOUNTER — Encounter: Payer: Self-pay | Admitting: *Deleted

## 2019-07-16 ENCOUNTER — Emergency Department (HOSPITAL_COMMUNITY)
Admission: EM | Admit: 2019-07-16 | Discharge: 2019-07-17 | Disposition: A | Discharge status: Home / Self Care | Payer: Medicaid Other | Attending: Emergency Medicine | Admitting: Emergency Medicine

## 2019-07-16 ENCOUNTER — Other Ambulatory Visit: Payer: Self-pay

## 2019-07-16 ENCOUNTER — Encounter (HOSPITAL_COMMUNITY): Payer: Self-pay | Admitting: Emergency Medicine

## 2019-07-16 DIAGNOSIS — F17210 Nicotine dependence, cigarettes, uncomplicated: Secondary | ICD-10-CM | POA: Insufficient documentation

## 2019-07-16 DIAGNOSIS — Z79899 Other long term (current) drug therapy: Secondary | ICD-10-CM | POA: Insufficient documentation

## 2019-07-16 DIAGNOSIS — R51 Headache: Secondary | ICD-10-CM | POA: Insufficient documentation

## 2019-07-16 DIAGNOSIS — N12 Tubulo-interstitial nephritis, not specified as acute or chronic: Secondary | ICD-10-CM

## 2019-07-16 LAB — CBC WITH DIFFERENTIAL/PLATELET
Abs Immature Granulocytes: 0.05 10*3/uL (ref 0.00–0.07)
Basophils Absolute: 0 10*3/uL (ref 0.0–0.1)
Basophils Relative: 0 %
Eosinophils Absolute: 0.1 10*3/uL (ref 0.0–0.5)
Eosinophils Relative: 1 %
HCT: 42.9 % (ref 36.0–46.0)
Hemoglobin: 14.4 g/dL (ref 12.0–15.0)
Immature Granulocytes: 0 %
Lymphocytes Relative: 12 %
Lymphs Abs: 1.4 10*3/uL (ref 0.7–4.0)
MCH: 30.6 pg (ref 26.0–34.0)
MCHC: 33.6 g/dL (ref 30.0–36.0)
MCV: 91.1 fL (ref 80.0–100.0)
Monocytes Absolute: 1.1 10*3/uL — ABNORMAL HIGH (ref 0.1–1.0)
Monocytes Relative: 10 %
Neutro Abs: 9.2 10*3/uL — ABNORMAL HIGH (ref 1.7–7.7)
Neutrophils Relative %: 77 %
Platelets: 297 10*3/uL (ref 150–400)
RBC: 4.71 MIL/uL (ref 3.87–5.11)
RDW: 12.3 % (ref 11.5–15.5)
WBC: 11.8 10*3/uL — ABNORMAL HIGH (ref 4.0–10.5)
nRBC: 0 % (ref 0.0–0.2)

## 2019-07-16 LAB — URINALYSIS, ROUTINE W REFLEX MICROSCOPIC
Bilirubin Urine: NEGATIVE
Glucose, UA: NEGATIVE mg/dL
Ketones, ur: NEGATIVE mg/dL
Nitrite: POSITIVE — AB
Protein, ur: 100 mg/dL — AB
Specific Gravity, Urine: 1.019 (ref 1.005–1.030)
pH: 5 (ref 5.0–8.0)

## 2019-07-16 LAB — COMPREHENSIVE METABOLIC PANEL
ALT: 27 U/L (ref 0–44)
AST: 16 U/L (ref 15–41)
Albumin: 3.4 g/dL — ABNORMAL LOW (ref 3.5–5.0)
Alkaline Phosphatase: 71 U/L (ref 38–126)
Anion gap: 13 (ref 5–15)
BUN: 14 mg/dL (ref 6–20)
CO2: 23 mmol/L (ref 22–32)
Calcium: 8.8 mg/dL — ABNORMAL LOW (ref 8.9–10.3)
Chloride: 100 mmol/L (ref 98–111)
Creatinine, Ser: 0.66 mg/dL (ref 0.44–1.00)
GFR calc Af Amer: >60 mL/min (ref >60)
GFR calc non Af Amer: >60 mL/min (ref >60)
Glucose, Bld: 103 mg/dL — ABNORMAL HIGH (ref 70–99)
Potassium: 4.4 mmol/L (ref 3.5–5.1)
Sodium: 136 mmol/L (ref 135–145)
Total Bilirubin: 1.3 mg/dL — ABNORMAL HIGH (ref 0.3–1.2)
Total Protein: 7.2 g/dL (ref 6.5–8.1)

## 2019-07-16 LAB — I-STAT BETA HCG BLOOD, ED (MC, WL, AP ONLY): I-stat hCG, quantitative: <5 m[IU]/mL (ref <5)

## 2019-07-16 LAB — LACTIC ACID, PLASMA: Lactic Acid, Venous: 1.2 mmol/L (ref 0.5–1.9)

## 2019-07-16 MED ORDER — SODIUM CHLORIDE 0.9% FLUSH
3.0000 mL | Freq: Once | INTRAVENOUS | Status: AC
  Administered 2019-07-17: 3 mL via INTRAVENOUS

## 2019-07-16 MED ORDER — ACETAMINOPHEN 325 MG PO TABS
650.0000 mg | ORAL_TABLET | Freq: Once | ORAL | Status: AC | PRN
  Administered 2019-07-16: 650 mg via ORAL
  Filled 2019-07-16: qty 2

## 2019-07-16 NOTE — ED Triage Notes (Signed)
Pt reports fever since Saturday. Pt reports she had UTI about a month ago, pt repots she has feels symptoms have worsened  since. Pt reports N/V and bilateral flank pain and abdominal pain. Pt's HR 140, Temp: 101.6. Pt took 400mg  of ibuprofen at 10am.

## 2019-07-17 ENCOUNTER — Emergency Department (HOSPITAL_COMMUNITY): Payer: Medicaid Other

## 2019-07-17 MED ORDER — ONDANSETRON HCL 4 MG PO TABS: 4.0000 mg | ORAL_TABLET | Freq: Four times a day (QID) | ORAL | 0 refills | Status: DC

## 2019-07-17 MED ORDER — DIPHENHYDRAMINE HCL 50 MG/ML IJ SOLN
12.5000 mg | Freq: Once | INTRAMUSCULAR | Status: AC
  Administered 2019-07-17: 12.5 mg via INTRAVENOUS
  Filled 2019-07-17: qty 1

## 2019-07-17 MED ORDER — IBUPROFEN 800 MG PO TABS
800.0000 mg | ORAL_TABLET | Freq: Once | ORAL | Status: AC
  Administered 2019-07-17: 800 mg via ORAL
  Filled 2019-07-17: qty 1

## 2019-07-17 MED ORDER — ONDANSETRON HCL 4 MG/2ML IJ SOLN
4.0000 mg | Freq: Once | INTRAMUSCULAR | Status: AC
Start: 2019-07-17 — End: 2019-07-17
  Administered 2019-07-17: 4 mg via INTRAVENOUS
  Filled 2019-07-17: qty 2

## 2019-07-17 MED ORDER — METOCLOPRAMIDE HCL 5 MG/ML IJ SOLN
10.0000 mg | Freq: Once | INTRAMUSCULAR | Status: AC
  Administered 2019-07-17: 10 mg via INTRAVENOUS
  Filled 2019-07-17: qty 2

## 2019-07-17 MED ORDER — SODIUM CHLORIDE 0.9 % IV SOLN
1.0000 g | Freq: Once | INTRAVENOUS | Status: AC
  Administered 2019-07-17: 1 g via INTRAVENOUS
  Filled 2019-07-17: qty 10

## 2019-07-17 MED ORDER — FENTANYL CITRATE (PF) 100 MCG/2ML IJ SOLN
50.0000 ug | Freq: Once | INTRAMUSCULAR | Status: AC
  Administered 2019-07-17: 50 ug via INTRAVENOUS
  Filled 2019-07-17: qty 2

## 2019-07-17 MED ORDER — KETOROLAC TROMETHAMINE 15 MG/ML IJ SOLN
15.0000 mg | Freq: Once | INTRAMUSCULAR | Status: AC
  Administered 2019-07-17: 15 mg via INTRAVENOUS
  Filled 2019-07-17: qty 1

## 2019-07-17 MED ORDER — SODIUM CHLORIDE 0.9 % IV BOLUS
1000.0000 mL | Freq: Once | INTRAVENOUS | Status: AC
  Administered 2019-07-17: 1000 mL via INTRAVENOUS

## 2019-07-17 MED ORDER — CEPHALEXIN 500 MG PO CAPS: 500.0000 mg | ORAL_CAPSULE | Freq: Four times a day (QID) | ORAL | 0 refills | Status: DC
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
## 2019-07-17 NOTE — ED Notes (Signed)
Patient refused covid testing at this time

## 2019-07-17 NOTE — ED Notes (Signed)
Patient returned from CT

## 2019-07-17 NOTE — Discharge Instructions (Signed)
Take the antibiotics as prescribed for kidney infection. Zofran for any nausea. Continue Tylenol and/or ibuprofen for headache and other discomfort.   A COVID test was collected tonight secondary to your headache pain and fever. Results will be available in 24-48 hours on MyChart. You have been given COVID discharge instructions not because you are positive but because you have a pending test and need to isolate until results are obtained.   Return to the emergency department with any worsening symptoms or new concerns.

## 2019-07-17 NOTE — ED Notes (Signed)
Pt. Had came up to station and had inquired about how long it would be until she is seen. Pt. Stated that her fever seemed to be going back up since it was last taken. NT had performed an oral temp on pt and was documented.

## 2019-07-17 NOTE — ED Notes (Signed)
Patient transported to CT 

## 2019-11-02 ENCOUNTER — Other Ambulatory Visit: Payer: Self-pay

## 2019-11-02 ENCOUNTER — Emergency Department (HOSPITAL_COMMUNITY)
Admission: EM | Admit: 2019-11-02 | Discharge: 2019-11-02 | Disposition: A | Discharge status: Home / Self Care | Payer: Medicaid Other | Attending: Emergency Medicine | Admitting: Emergency Medicine

## 2019-11-02 ENCOUNTER — Encounter (HOSPITAL_COMMUNITY): Payer: Self-pay | Admitting: Emergency Medicine

## 2019-11-02 DIAGNOSIS — L2089 Other atopic dermatitis: Secondary | ICD-10-CM | POA: Insufficient documentation

## 2019-11-02 DIAGNOSIS — Z79899 Other long term (current) drug therapy: Secondary | ICD-10-CM | POA: Insufficient documentation

## 2019-11-02 DIAGNOSIS — F17210 Nicotine dependence, cigarettes, uncomplicated: Secondary | ICD-10-CM | POA: Insufficient documentation

## 2019-11-02 LAB — POC URINE PREG, ED: Preg Test, Ur: NEGATIVE

## 2019-11-02 LAB — GROUP A STREP BY PCR: Group A Strep by PCR: NOT DETECTED

## 2019-11-02 MED ORDER — LIDOCAINE VISCOUS HCL 2 % MT SOLN
15.0000 mL | Freq: Once | OROMUCOSAL | Status: AC
  Administered 2019-11-02: 15 mL via OROMUCOSAL
  Filled 2019-11-02: qty 15

## 2019-11-02 MED ORDER — HYDROCORTISONE 1 % EX CREA: TOPICAL_CREAM | CUTANEOUS | 0 refills | Status: DC

## 2019-11-02 MED ORDER — KETOROLAC TROMETHAMINE 30 MG/ML IJ SOLN
30.0000 mg | Freq: Once | INTRAMUSCULAR | Status: AC
  Administered 2019-11-02: 30 mg via INTRAMUSCULAR
  Filled 2019-11-02: qty 1

## 2019-11-02 MED ORDER — DEXAMETHASONE SODIUM PHOSPHATE 10 MG/ML IJ SOLN
10.0000 mg | Freq: Once | INTRAMUSCULAR | Status: AC
  Administered 2019-11-02: 10 mg via INTRAMUSCULAR
  Filled 2019-11-02: qty 1

## 2019-11-02 MED ORDER — PENICILLIN G BENZATHINE 1200000 UNIT/2ML IM SUSP
1.2000 10*6.[IU] | Freq: Once | INTRAMUSCULAR | Status: AC
  Administered 2019-11-02: 1.2 10*6.[IU] via INTRAMUSCULAR
  Filled 2019-11-02: qty 2

## 2019-11-02 NOTE — ED Triage Notes (Signed)
Pt. Stated, I was around a friend who had strep and Ive had it before and its just like it.
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
## 2019-11-02 NOTE — Discharge Instructions (Addendum)
Given the antibiotics, steroids and anti-inflammatories for your sore throat.  May continue taking Tylenol as needed for pain.  I would suggest increasing your liquid intake.  If you develop severe worsening, unilateral neck pain, inability to tolerate any liquids please help the emergency department or follow-up with ear nose and throat physician.  Your rash is consistent with eczema.  I have prescribed you with a topical steroid cream.  You may follow-up with your primary care provider or dermatology as needed for this.

## 2020-01-20 ENCOUNTER — Encounter (HOSPITAL_COMMUNITY): Payer: Self-pay

## 2020-01-20 ENCOUNTER — Emergency Department (HOSPITAL_COMMUNITY)
Admission: EM | Admit: 2020-01-20 | Discharge: 2020-01-21 | Disposition: A | Discharge status: Home / Self Care | Payer: Medicaid Other | Attending: Emergency Medicine | Admitting: Emergency Medicine

## 2020-01-20 ENCOUNTER — Other Ambulatory Visit: Payer: Self-pay

## 2020-01-20 DIAGNOSIS — Z720 Tobacco use: Secondary | ICD-10-CM | POA: Insufficient documentation

## 2020-01-20 DIAGNOSIS — R110 Nausea: Secondary | ICD-10-CM | POA: Insufficient documentation

## 2020-01-20 DIAGNOSIS — N10 Acute tubulo-interstitial nephritis: Secondary | ICD-10-CM | POA: Insufficient documentation

## 2020-01-20 DIAGNOSIS — N12 Tubulo-interstitial nephritis, not specified as acute or chronic: Secondary | ICD-10-CM

## 2020-01-20 LAB — URINALYSIS, ROUTINE W REFLEX MICROSCOPIC
Bilirubin Urine: NEGATIVE
Glucose, UA: NEGATIVE mg/dL
Ketones, ur: NEGATIVE mg/dL
Nitrite: NEGATIVE
Protein, ur: 30 mg/dL — AB
Specific Gravity, Urine: 1.028 (ref 1.005–1.030)
WBC, UA: >50 WBC/hpf — ABNORMAL HIGH (ref 0–5)
pH: 5 (ref 5.0–8.0)

## 2020-01-20 LAB — CBC
HCT: 43.1 % (ref 36.0–46.0)
Hemoglobin: 14.5 g/dL (ref 12.0–15.0)
MCH: 30.9 pg (ref 26.0–34.0)
MCHC: 33.6 g/dL (ref 30.0–36.0)
MCV: 91.9 fL (ref 80.0–100.0)
Platelets: 265 10*3/uL (ref 150–400)
RBC: 4.69 MIL/uL (ref 3.87–5.11)
RDW: 12.8 % (ref 11.5–15.5)
WBC: 8.8 10*3/uL (ref 4.0–10.5)
nRBC: 0 % (ref 0.0–0.2)

## 2020-01-20 LAB — BASIC METABOLIC PANEL
Anion gap: 10 (ref 5–15)
BUN: 12 mg/dL (ref 6–20)
CO2: 20 mmol/L — ABNORMAL LOW (ref 22–32)
Calcium: 8.6 mg/dL — ABNORMAL LOW (ref 8.9–10.3)
Chloride: 105 mmol/L (ref 98–111)
Creatinine, Ser: 0.66 mg/dL (ref 0.44–1.00)
GFR calc Af Amer: >60 mL/min (ref >60)
GFR calc non Af Amer: >60 mL/min (ref >60)
Glucose, Bld: 115 mg/dL — ABNORMAL HIGH (ref 70–99)
Potassium: 3.9 mmol/L (ref 3.5–5.1)
Sodium: 135 mmol/L (ref 135–145)

## 2020-01-20 LAB — I-STAT BETA HCG BLOOD, ED (MC, WL, AP ONLY): I-stat hCG, quantitative: <5 m[IU]/mL (ref <5)

## 2020-01-20 NOTE — ED Triage Notes (Signed)
Pt presents with multiple complaints, chronic kidney pain x2 months, recently seen here for the same. Pt also reports "flu like" symptoms x3 days.

## 2020-01-21 MED ORDER — ONDANSETRON HCL 4 MG/2ML IJ SOLN
4.0000 mg | Freq: Once | INTRAMUSCULAR | Status: AC
  Administered 2020-01-21: 4 mg via INTRAVENOUS
  Filled 2020-01-21: qty 2

## 2020-01-21 MED ORDER — KETOROLAC TROMETHAMINE 30 MG/ML IJ SOLN
30.0000 mg | Freq: Once | INTRAMUSCULAR | Status: AC
  Administered 2020-01-21: 30 mg via INTRAVENOUS
  Filled 2020-01-21: qty 1

## 2020-01-21 MED ORDER — ONDANSETRON 4 MG PO TBDP: 4.0000 mg | ORAL_TABLET | Freq: Three times a day (TID) | ORAL | 0 refills | Status: DC | PRN

## 2020-01-21 MED ORDER — SODIUM CHLORIDE 0.9 % IV BOLUS
1000.0000 mL | Freq: Once | INTRAVENOUS | Status: AC
  Administered 2020-01-21: 1000 mL via INTRAVENOUS

## 2020-01-21 MED ORDER — PHENAZOPYRIDINE HCL 200 MG PO TABS: 200.0000 mg | ORAL_TABLET | Freq: Three times a day (TID) | ORAL | 0 refills | Status: DC

## 2020-01-21 MED ORDER — CEPHALEXIN 500 MG PO CAPS: 500.0000 mg | ORAL_CAPSULE | Freq: Four times a day (QID) | ORAL | 0 refills | Status: DC

## 2020-01-21 MED ORDER — SODIUM CHLORIDE 0.9 % IV SOLN
1.0000 g | Freq: Once | INTRAVENOUS | Status: AC
  Administered 2020-01-21: 1 g via INTRAVENOUS
  Filled 2020-01-21: qty 10
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
## 2020-01-21 NOTE — ED Notes (Signed)
Pt came to the ED per triage complaint. Pt conscious, breathing, and A&Ox4. Pt brought back to [facility] via wheelchair. Pt endorses "I think I have a kidney infection and it feels similar to last time". Chest rise and fall equally with non-labored breathing. Lungs clear apex to base. Abd soft and tender in the flank regions bilaterally. Pt denies chest pain, diarrhea, and shortness of breath. Pt endorses N/V and f/c. Pt endorses pain 8 out of 10 pain that's vice-like. PIVC placed on the RAC with a 20G which had positive blood return and flushed without pain or infiltration. Blood collected, labeled, and sent to lab. Bed in lowest position with call light within reach. Pt on continuous blood pressure, pulse ox, and cardiac monitor. Will continue to monitor. Awaiting MD eval. No distress noted.

## 2020-01-21 NOTE — Discharge Instructions (Signed)
Take the prescribed medication as directed.  Make sure to drink lots of water. The pyridium will turn your urine bright red/orange. Follow-up with your primary care doctor. Return to the ED for new or worsening symptoms.

## 2020-01-21 NOTE — ED Notes (Signed)
Pt was discharged from the ED. Pt read and understood discharge paperwork. Pt had vital signs completed. Pt conscious, breathing, and A&Ox4. No distress noted. Pt speaking in complete sentences. Pt ambulated out of the ED with a smooth and steady gait. E-signature not available.  

## 2020-01-23 ENCOUNTER — Ambulatory Visit: Payer: Self-pay | Admitting: *Deleted

## 2020-01-23 NOTE — Telephone Encounter (Signed)
Pt stated that she had been to the Ed on Monday and was treated for pyelonephritis. She was put on antibiotics and discharged to home.  She feels worst now. She has been taking the antibiotics. Her temp this afternoon was 103.2. She has alternated Tylenol with Ibuprofen. Also c/o migraine, sweating. Not eating. She does not have a pcp. She is advised to go back to the ED to be treated. She voiced understanding.  Her phone line was disconnected. And not able to call back.  Reason for Disposition . Nursing judgment  Protocols used: NO GUIDELINE OR REFERENCE AVAILABLE-A-AH

## 2020-01-25 ENCOUNTER — Emergency Department (HOSPITAL_COMMUNITY)
Admission: EM | Admit: 2020-01-25 | Discharge: 2020-01-25 | Disposition: A | Discharge status: Home / Self Care | Payer: Self-pay | Attending: Emergency Medicine | Admitting: Emergency Medicine

## 2020-01-25 ENCOUNTER — Other Ambulatory Visit: Payer: Self-pay

## 2020-01-25 ENCOUNTER — Encounter (HOSPITAL_COMMUNITY): Payer: Self-pay | Admitting: Emergency Medicine

## 2020-01-25 DIAGNOSIS — F17210 Nicotine dependence, cigarettes, uncomplicated: Secondary | ICD-10-CM | POA: Insufficient documentation

## 2020-01-25 DIAGNOSIS — R509 Fever, unspecified: Secondary | ICD-10-CM | POA: Insufficient documentation

## 2020-01-25 DIAGNOSIS — Z20822 Contact with and (suspected) exposure to covid-19: Secondary | ICD-10-CM | POA: Insufficient documentation

## 2020-01-25 DIAGNOSIS — J029 Acute pharyngitis, unspecified: Secondary | ICD-10-CM | POA: Insufficient documentation

## 2020-01-25 LAB — URINALYSIS, ROUTINE W REFLEX MICROSCOPIC
Bilirubin Urine: NEGATIVE
Glucose, UA: NEGATIVE mg/dL
Ketones, ur: 20 mg/dL — AB
Leukocytes,Ua: NEGATIVE
Nitrite: NEGATIVE
Protein, ur: 100 mg/dL — AB
Specific Gravity, Urine: 1.021 (ref 1.005–1.030)
pH: 6 (ref 5.0–8.0)

## 2020-01-25 LAB — CBC WITH DIFFERENTIAL/PLATELET
Abs Immature Granulocytes: 0.06 10*3/uL (ref 0.00–0.07)
Basophils Absolute: 0 10*3/uL (ref 0.0–0.1)
Basophils Relative: 0 %
Eosinophils Absolute: 0 10*3/uL (ref 0.0–0.5)
Eosinophils Relative: 0 %
HCT: 39.9 % (ref 36.0–46.0)
Hemoglobin: 13.6 g/dL (ref 12.0–15.0)
Immature Granulocytes: 1 %
Lymphocytes Relative: 7 %
Lymphs Abs: 0.9 10*3/uL (ref 0.7–4.0)
MCH: 30.6 pg (ref 26.0–34.0)
MCHC: 34.1 g/dL (ref 30.0–36.0)
MCV: 89.9 fL (ref 80.0–100.0)
Monocytes Absolute: 1 10*3/uL (ref 0.1–1.0)
Monocytes Relative: 8 %
Neutro Abs: 9.8 10*3/uL — ABNORMAL HIGH (ref 1.7–7.7)
Neutrophils Relative %: 84 %
Platelets: 305 10*3/uL (ref 150–400)
RBC: 4.44 MIL/uL (ref 3.87–5.11)
RDW: 12.9 % (ref 11.5–15.5)
WBC: 11.7 10*3/uL — ABNORMAL HIGH (ref 4.0–10.5)
nRBC: 0 % (ref 0.0–0.2)

## 2020-01-25 LAB — COMPREHENSIVE METABOLIC PANEL
ALT: 22 U/L (ref 0–44)
AST: 12 U/L — ABNORMAL LOW (ref 15–41)
Albumin: 2.7 g/dL — ABNORMAL LOW (ref 3.5–5.0)
Alkaline Phosphatase: 71 U/L (ref 38–126)
Anion gap: 12 (ref 5–15)
BUN: 10 mg/dL (ref 6–20)
CO2: 23 mmol/L (ref 22–32)
Calcium: 8 mg/dL — ABNORMAL LOW (ref 8.9–10.3)
Chloride: 100 mmol/L (ref 98–111)
Creatinine, Ser: 0.6 mg/dL (ref 0.44–1.00)
GFR calc Af Amer: >60 mL/min (ref >60)
GFR calc non Af Amer: >60 mL/min (ref >60)
Glucose, Bld: 104 mg/dL — ABNORMAL HIGH (ref 70–99)
Potassium: 3.3 mmol/L — ABNORMAL LOW (ref 3.5–5.1)
Sodium: 135 mmol/L (ref 135–145)
Total Bilirubin: 0.8 mg/dL (ref 0.3–1.2)
Total Protein: 6.5 g/dL (ref 6.5–8.1)

## 2020-01-25 LAB — I-STAT BETA HCG BLOOD, ED (MC, WL, AP ONLY): I-stat hCG, quantitative: <5 m[IU]/mL (ref <5)

## 2020-01-25 LAB — PREGNANCY, URINE: Preg Test, Ur: NEGATIVE

## 2020-01-25 LAB — GROUP A STREP BY PCR: Group A Strep by PCR: NOT DETECTED

## 2020-01-25 LAB — POC SARS CORONAVIRUS 2 AG -  ED: SARS Coronavirus 2 Ag: NEGATIVE

## 2020-01-25 MED ORDER — SODIUM CHLORIDE 0.9 % IV BOLUS
1000.0000 mL | Freq: Once | INTRAVENOUS | Status: AC
  Administered 2020-01-25: 1000 mL via INTRAVENOUS

## 2020-01-25 MED ORDER — POTASSIUM CHLORIDE CRYS ER 20 MEQ PO TBCR
20.0000 meq | EXTENDED_RELEASE_TABLET | Freq: Once | ORAL | Status: AC
  Administered 2020-01-25: 20 meq via ORAL
  Filled 2020-01-25: qty 1

## 2020-01-25 MED ORDER — HYDROMORPHONE HCL 1 MG/ML IJ SOLN
1.0000 mg | Freq: Once | INTRAMUSCULAR | Status: AC
  Administered 2020-01-25: 1 mg via INTRAVENOUS
  Filled 2020-01-25: qty 1

## 2020-01-25 MED ORDER — ACETAMINOPHEN 325 MG PO TABS
650.0000 mg | ORAL_TABLET | Freq: Once | ORAL | Status: AC
  Administered 2020-01-25: 650 mg via ORAL
  Filled 2020-01-25: qty 2

## 2020-01-25 MED ORDER — ONDANSETRON HCL 4 MG/2ML IJ SOLN
4.0000 mg | Freq: Once | INTRAMUSCULAR | Status: AC
  Administered 2020-01-25: 4 mg via INTRAVENOUS
  Filled 2020-01-25: qty 2

## 2020-01-25 MED ORDER — KETOROLAC TROMETHAMINE 15 MG/ML IJ SOLN
15.0000 mg | Freq: Once | INTRAMUSCULAR | Status: AC
  Administered 2020-01-25: 15 mg via INTRAVENOUS
  Filled 2020-01-25: qty 1

## 2020-01-25 MED ORDER — ACETAMINOPHEN 500 MG PO TABS
500.0000 mg | ORAL_TABLET | Freq: Once | ORAL | Status: AC
  Administered 2020-01-25: 500 mg via ORAL
  Filled 2020-01-25: qty 1

## 2020-01-25 MED ORDER — PENICILLIN G BENZATHINE 1200000 UNIT/2ML IM SUSP
1.2000 10*6.[IU] | Freq: Once | INTRAMUSCULAR | Status: AC
  Administered 2020-01-25: 1.2 10*6.[IU] via INTRAMUSCULAR
  Filled 2020-01-25: qty 2

## 2020-01-25 NOTE — ED Notes (Signed)
Pt requesting meds for migraine.

## 2020-01-25 NOTE — ED Triage Notes (Signed)
Pt. Stated, I was here and was treated for pyelonephritis and Im no better. I told the I also had a sore throat and nobody checked it.  I have a sore throat , headache and my kidneys are still hurting.
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
## 2020-01-25 NOTE — Discharge Instructions (Addendum)
Please read the attachment on pharyngitis.  You were found to be strep negative, however you were treated empirically with penicillin IM.  If it is of viral etiology, that would also help explain your headache symptoms.  Viral illnesses are self-limiting and will resolve spontaneously with time.  Continue take Tylenol and ibuprofen as needed for your symptoms of headache and sore throat.  I also recommend Chloraseptic spray as well as warm tea with honey and cough drops, as needed.  If your headache fails to improve with conservative management, however like for you to call [facility] to schedule appointment for ongoing evaluation and management of your headache disorder.  Return to the ED or seek immediate medical attention for any new or worsening symptoms.

## 2020-01-25 NOTE — ED Triage Notes (Signed)
Plunger of Hydromorphone came out and spilled the amount of hydromorphone , had to re-order.

## 2020-01-25 NOTE — ED Notes (Signed)
Patient given discharge instructions patient verbalizes understanding. 

## 2020-01-26 LAB — URINE CULTURE: Culture: NO GROWTH

## 2021-04-05 IMAGING — CT CT RENAL STONE PROTOCOL
2 of 4 series · 16 of 46 positions shown, 18 images · non-contrast
Comparison: CT of the abdomen and pelvis 09/26/2014

CLINICAL DATA: Bilateral flank pain.  Nausea and vomiting.  Fever.

EXAM:
CT ABDOMEN AND PELVIS WITHOUT CONTRAST
TECHNIQUE: Multidetector CT imaging of the abdomen and pelvis was performed
following the standard protocol without IV contrast.

[Series 3: ap without · axial · non-contrast · 0.89mm/px · z∈[+815,+1250]mm · 13 of 99 slices shown, 15 images]
[im 6/99  soft-tissue]
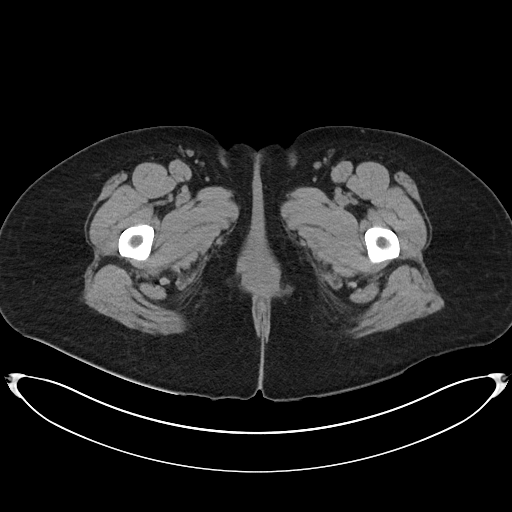
[im 6/99  bone]
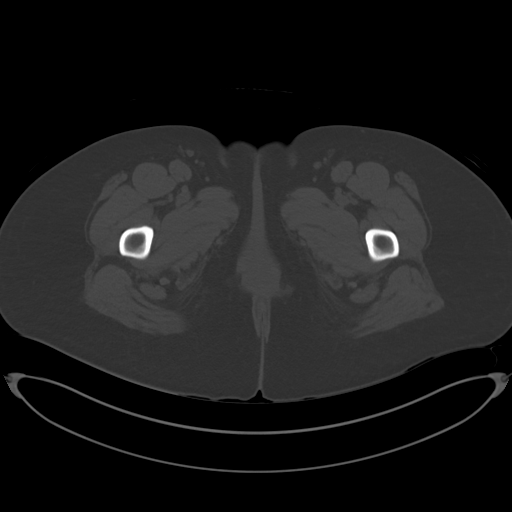
[im 11/99  soft-tissue]
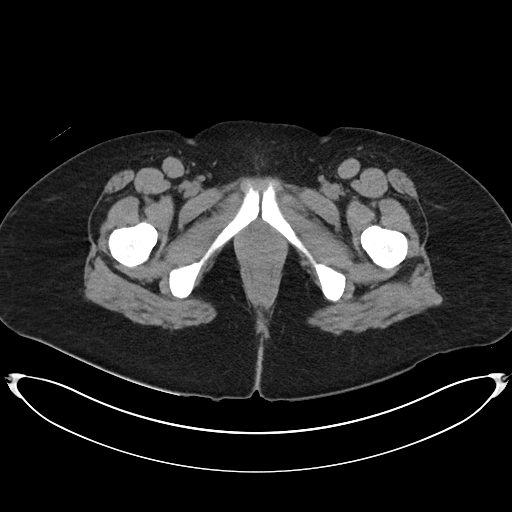
[im 22/99  soft-tissue]
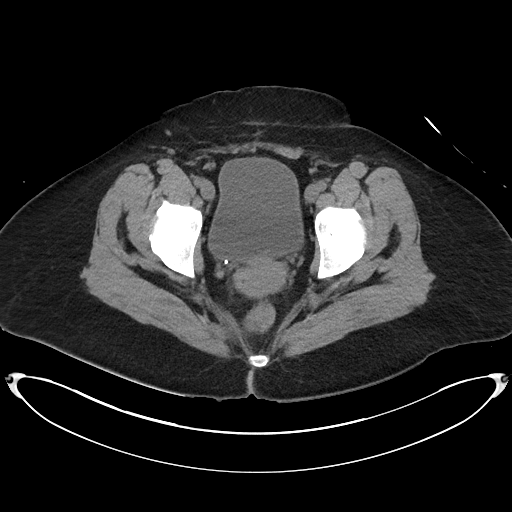
[im 28/99  soft-tissue]
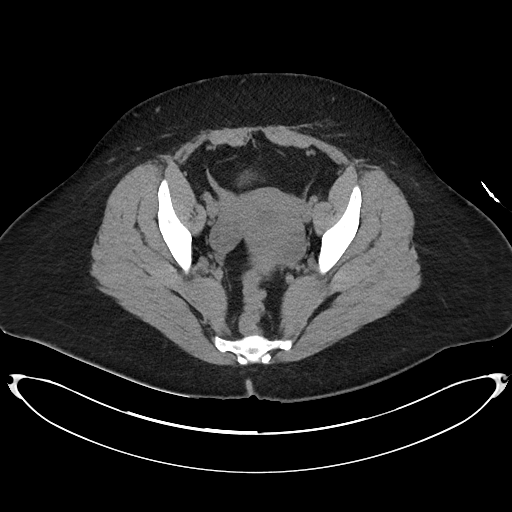
[im 33/99  soft-tissue]
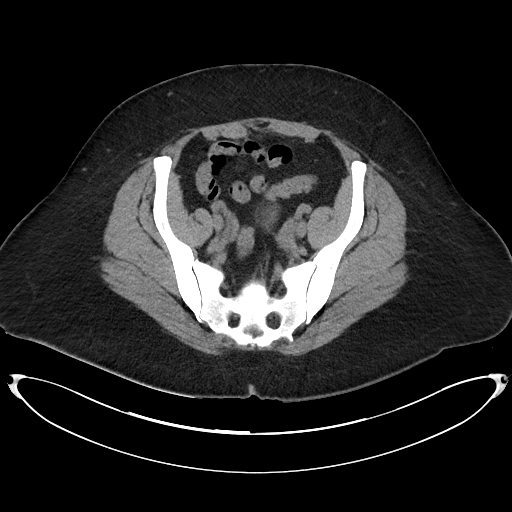
[im 44/99  soft-tissue]
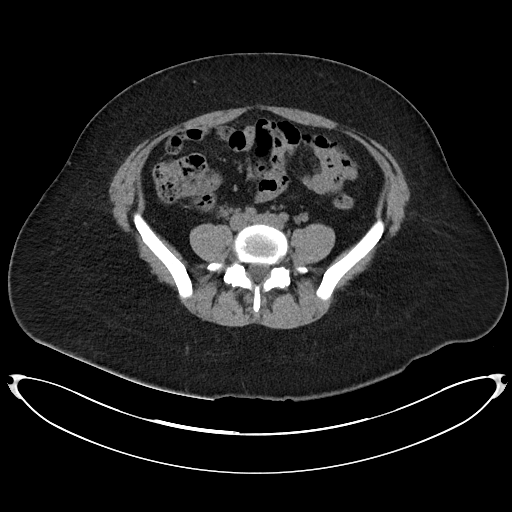
[im 50/99  soft-tissue]
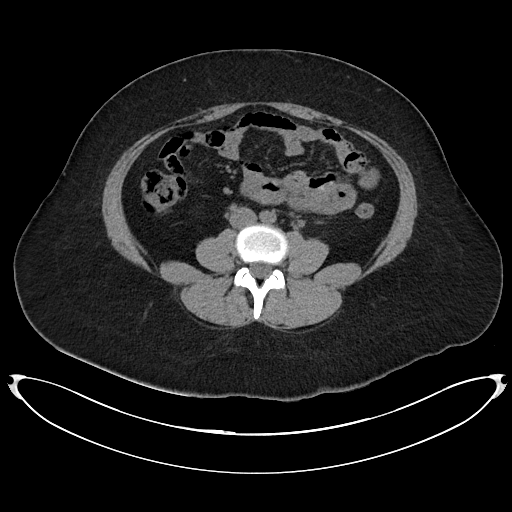
[im 55/99  soft-tissue]
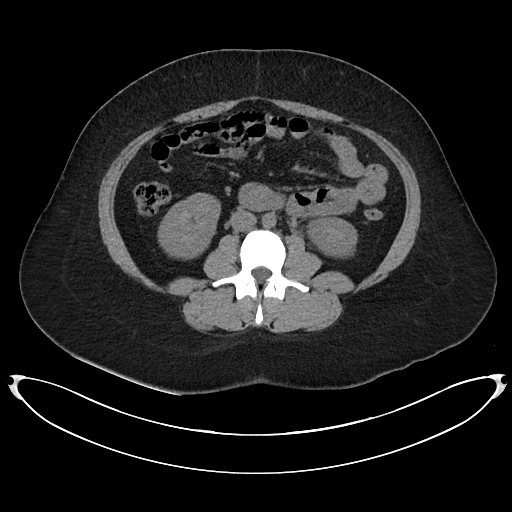
[im 66/99  soft-tissue]
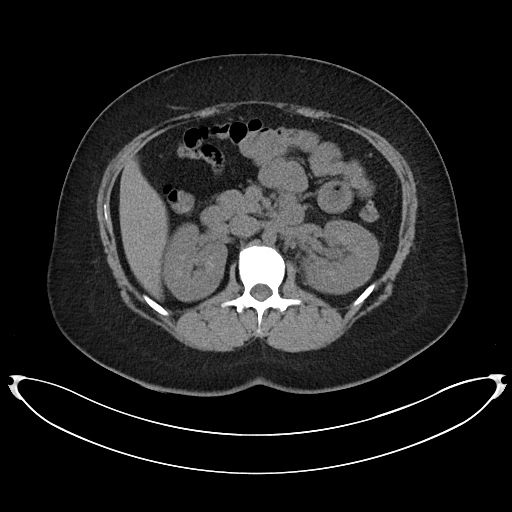
[im 66/99  bone]
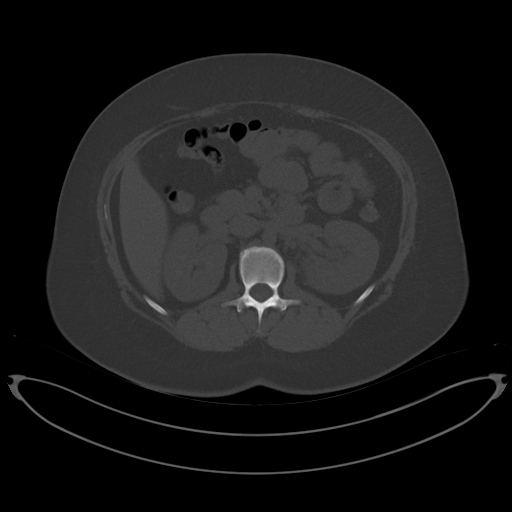
[im 71/99  soft-tissue]
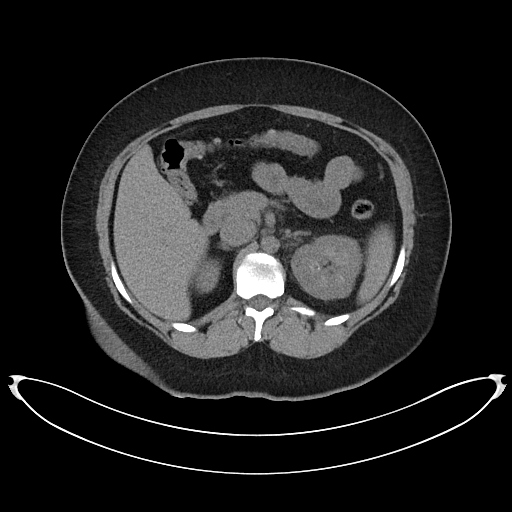
[im 77/99  soft-tissue]
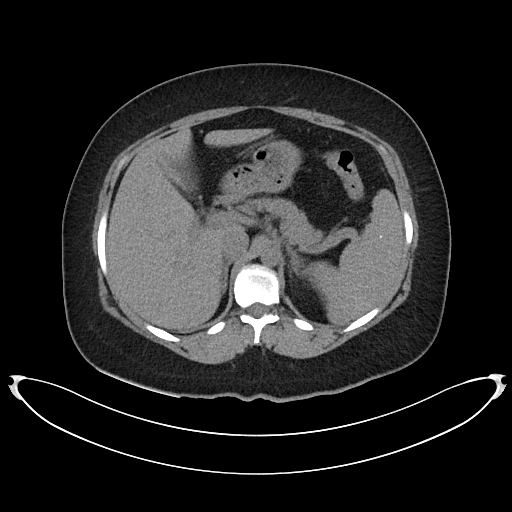
[im 88/99  soft-tissue]
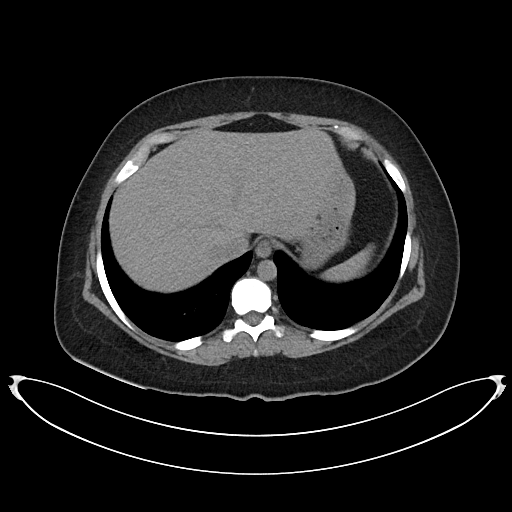
[im 93/99  soft-tissue]
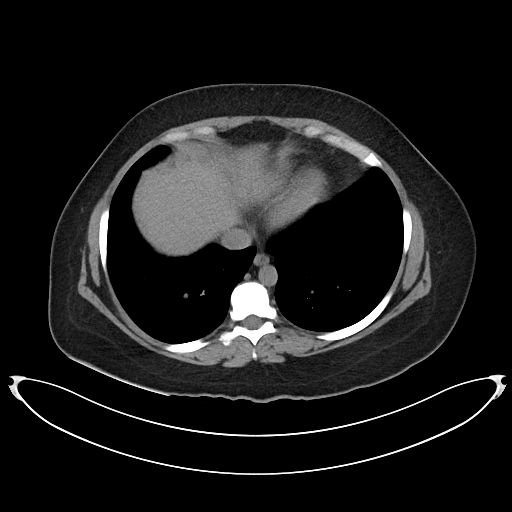

[Series 6: cor · coronal · 0.86mm/px · 3 of 112 slices shown]
[im 38/112  soft-tissue]
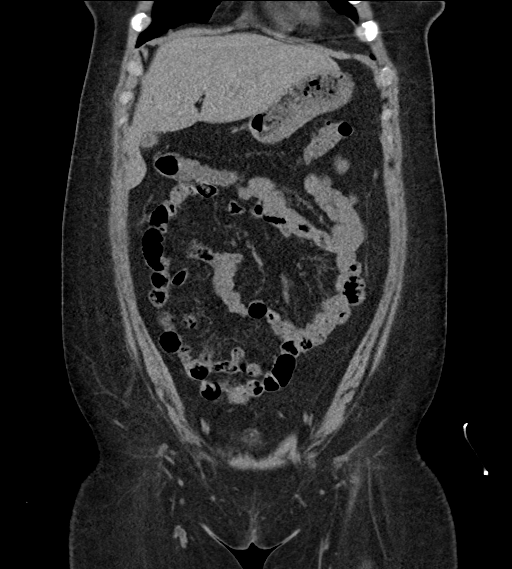
[im 50/112  soft-tissue]
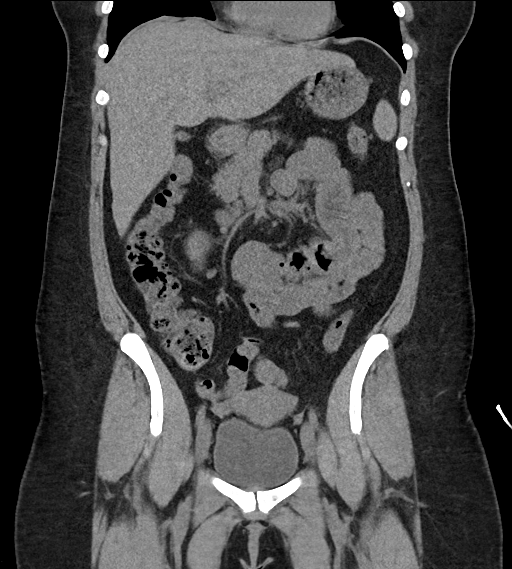
[im 62/112  soft-tissue]
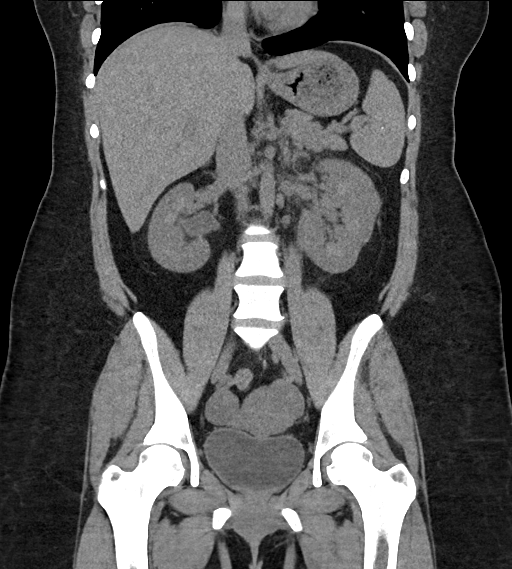

[16 of 46 positions shown; findings below may reference images not displayed]

FINDINGS: Lower chest: Lung bases are clear without focal nodule, mass, or
airspace disease. The heart size is normal. No significant pleural
or pericardial effusions are present.

Hepatobiliary: No focal liver abnormality is seen. No gallstones,
gallbladder wall thickening, or biliary dilatation.

Pancreas: Unremarkable. No pancreatic ductal dilatation or
surrounding inflammatory changes.

Spleen: Punctate calcifications are present in the spleen. No focal
lesion is present otherwise. There is no mass lesion. Spleen size is
normal.

Adrenals/Urinary Tract: Adrenal glands are normal. There is no
nephrolithiasis. No mass lesion is present. There is asymmetric
stranding about the left kidney. The left kidney is mildly enlarged
compared to the right. Ureters are within normal limits. Urinary
bladder is unremarkable.

Stomach/Bowel: Stomach and duodenum are normal. Small bowel is
within. Terminal ileum is normal. The appendix is visualized and
within normal limits. Ascending and transverse colon are normal. The
descending and sigmoid colon are normal.

Vascular/Lymphatic: No significant vascular findings are present. No
enlarged abdominal or pelvic lymph nodes.

Reproductive: Uterus and bilateral adnexa are unremarkable.

Other: No abdominal wall hernia or abnormality. No abdominopelvic
ascites.

Musculoskeletal: Vertebral body heights and alignment are normal.
Bony pelvis is unremarkable.
IMPRESSION: 1. Asymmetric enlargement and mild inflammatory changes about the
left kidney concerning for pyelonephritis in the setting of urinary
tract infection.
2. No stone or mass lesion.  No obstruction.
3. Otherwise unremarkable CT of the abdomen and pelvis.

## 2021-10-15 ENCOUNTER — Ambulatory Visit: Payer: Medicaid Other | Admitting: Obstetrics & Gynecology

## 2023-02-09 ENCOUNTER — Observation Stay (HOSPITAL_BASED_OUTPATIENT_CLINIC_OR_DEPARTMENT_OTHER)
Admission: EM | Admit: 2023-02-09 | Discharge: 2023-02-11 | Disposition: A | Discharge status: Home / Self Care | Payer: Medicaid Other | Attending: Obstetrics & Gynecology | Admitting: Obstetrics & Gynecology

## 2023-02-09 ENCOUNTER — Encounter (HOSPITAL_BASED_OUTPATIENT_CLINIC_OR_DEPARTMENT_OTHER): Payer: Self-pay | Admitting: Emergency Medicine

## 2023-02-09 ENCOUNTER — Other Ambulatory Visit: Payer: Self-pay

## 2023-02-09 ENCOUNTER — Emergency Department (HOSPITAL_BASED_OUTPATIENT_CLINIC_OR_DEPARTMENT_OTHER): Payer: Medicaid Other

## 2023-02-09 DIAGNOSIS — R42 Dizziness and giddiness: Secondary | ICD-10-CM | POA: Diagnosis not present

## 2023-02-09 DIAGNOSIS — S060X0A Concussion without loss of consciousness, initial encounter: Secondary | ICD-10-CM

## 2023-02-09 DIAGNOSIS — D649 Anemia, unspecified: Secondary | ICD-10-CM | POA: Diagnosis not present

## 2023-02-09 DIAGNOSIS — S0990XA Unspecified injury of head, initial encounter: Secondary | ICD-10-CM | POA: Diagnosis present

## 2023-02-09 DIAGNOSIS — Y92009 Unspecified place in unspecified non-institutional (private) residence as the place of occurrence of the external cause: Secondary | ICD-10-CM | POA: Diagnosis not present

## 2023-02-09 DIAGNOSIS — Z79899 Other long term (current) drug therapy: Secondary | ICD-10-CM | POA: Insufficient documentation

## 2023-02-09 DIAGNOSIS — W19XXXA Unspecified fall, initial encounter: Secondary | ICD-10-CM

## 2023-02-09 DIAGNOSIS — O0480 (Induced) termination of pregnancy with unspecified complications: Secondary | ICD-10-CM

## 2023-02-09 DIAGNOSIS — F17210 Nicotine dependence, cigarettes, uncomplicated: Secondary | ICD-10-CM | POA: Diagnosis not present

## 2023-02-09 DIAGNOSIS — W01198A Fall on same level from slipping, tripping and stumbling with subsequent striking against other object, initial encounter: Secondary | ICD-10-CM | POA: Diagnosis not present

## 2023-02-09 LAB — BASIC METABOLIC PANEL
Anion gap: 11 (ref 5–15)
BUN: 6 mg/dL (ref 6–20)
CO2: 22 mmol/L (ref 22–32)
Calcium: 9.2 mg/dL (ref 8.9–10.3)
Chloride: 103 mmol/L (ref 98–111)
Creatinine, Ser: 0.49 mg/dL (ref 0.44–1.00)
GFR, Estimated: >60 mL/min (ref >60)
Glucose, Bld: 109 mg/dL — ABNORMAL HIGH (ref 70–99)
Potassium: 3.7 mmol/L (ref 3.5–5.1)
Sodium: 136 mmol/L (ref 135–145)

## 2023-02-09 LAB — CBC WITH DIFFERENTIAL/PLATELET
Abs Immature Granulocytes: 0.04 10*3/uL (ref 0.00–0.07)
Basophils Absolute: 0 10*3/uL (ref 0.0–0.1)
Basophils Relative: 0 %
Eosinophils Absolute: 0.2 10*3/uL (ref 0.0–0.5)
Eosinophils Relative: 3 %
HCT: 17.8 % — ABNORMAL LOW (ref 36.0–46.0)
Hemoglobin: 5.5 g/dL — CL (ref 12.0–15.0)
Immature Granulocytes: 1 %
Lymphocytes Relative: 30 %
Lymphs Abs: 2.1 10*3/uL (ref 0.7–4.0)
MCH: 26.7 pg (ref 26.0–34.0)
MCHC: 30.9 g/dL (ref 30.0–36.0)
MCV: 86.4 fL (ref 80.0–100.0)
Monocytes Absolute: 0.6 10*3/uL (ref 0.1–1.0)
Monocytes Relative: 9 %
Neutro Abs: 4 10*3/uL (ref 1.7–7.7)
Neutrophils Relative %: 57 %
Platelets: 732 10*3/uL — ABNORMAL HIGH (ref 150–400)
RBC: 2.06 MIL/uL — ABNORMAL LOW (ref 3.87–5.11)
RDW: 15.2 % (ref 11.5–15.5)
WBC: 7 10*3/uL (ref 4.0–10.5)
nRBC: 0.4 % — ABNORMAL HIGH (ref 0.0–0.2)

## 2023-02-09 LAB — HCG, QUANTITATIVE, PREGNANCY: hCG, Beta Chain, Quant, S: 212 m[IU]/mL — ABNORMAL HIGH (ref <5)

## 2023-02-09 LAB — PREPARE RBC (CROSSMATCH)

## 2023-02-09 MED ORDER — HYDROCODONE-ACETAMINOPHEN 5-325 MG PO TABS
1.0000 | ORAL_TABLET | ORAL | Status: DC | PRN
  Administered 2023-02-10 – 2023-02-11 (×4): 1 via ORAL
  Filled 2023-02-09 (×4): qty 1

## 2023-02-09 MED ORDER — ACETAMINOPHEN 325 MG PO TABS
650.0000 mg | ORAL_TABLET | Freq: Once | ORAL | Status: AC
  Administered 2023-02-10: 650 mg via ORAL
  Filled 2023-02-09: qty 2

## 2023-02-09 MED ORDER — DIPHENHYDRAMINE HCL 25 MG PO CAPS
25.0000 mg | ORAL_CAPSULE | Freq: Once | ORAL | Status: AC
  Administered 2023-02-10: 25 mg via ORAL
  Filled 2023-02-09: qty 1

## 2023-02-09 MED ORDER — DIPHENHYDRAMINE HCL 25 MG PO CAPS: 25.0000 mg | ORAL_CAPSULE | ORAL | Status: DC | PRN

## 2023-02-09 MED ORDER — PRENATAL MULTIVITAMIN CH
1.0000 | ORAL_TABLET | Freq: Every day | ORAL | Status: DC
  Administered 2023-02-11: 1 via ORAL
  Filled 2023-02-09: qty 1

## 2023-02-09 MED ORDER — ONDANSETRON 4 MG PO TBDP: 4.0000 mg | ORAL_TABLET | Freq: Three times a day (TID) | ORAL | Status: DC | PRN

## 2023-02-09 MED ORDER — FENTANYL CITRATE PF 50 MCG/ML IJ SOSY
50.0000 ug | PREFILLED_SYRINGE | Freq: Once | INTRAMUSCULAR | Status: DC
  Filled 2023-02-09: qty 1

## 2023-02-09 MED ORDER — FUROSEMIDE 10 MG/ML IJ SOLN
20.0000 mg | Freq: Once | INTRAMUSCULAR | Status: AC
  Administered 2023-02-10: 20 mg via INTRAVENOUS
  Filled 2023-02-09: qty 2

## 2023-02-09 MED ORDER — SODIUM CHLORIDE 0.9% IV SOLUTION: Freq: Once | INTRAVENOUS | Status: AC

## 2023-02-09 MED ORDER — BUTALBITAL-APAP-CAFFEINE 50-325-40 MG PO TABS
1.0000 | ORAL_TABLET | Freq: Once | ORAL | Status: AC
  Administered 2023-02-09: 1 via ORAL
  Filled 2023-02-09: qty 1

## 2023-02-09 NOTE — ED Provider Notes (Signed)
[facility] Provider Note   CSN: [id number] Arrival date & time: 02/09/23  1942     History  Chief Complaint  Patient presents with   Head Injury    [name] [name] is a 32 y.o. female.  HPI     32 year old G"number unknown"P4 with recent medical termination of pregnancy 3/9 at 9wk 2days presents with concern for vaginal bleeding weeks ago now improved, lightheadedness, fatigue, resulting in falls, head trauma, headache.   P4 9wk 2 days twins 3/9, medications  Started having severe pain, contractions Severe bleeding 3/10-14, soaking more than 2 pads an hour Couldn't get to Dr. At first 2 weeks ago Thursday drove to clinic, saw her said anemic, dehydrated, weawk, vomiting, given ibuprofen, nothing working for pain. Did not do labs. Lower abdominal pain Then next morning fell down, hit head, almost passed out Ringing in ears, sounds like someone beating on metal building, hit left side. Nausea. Numbness bilat finger tips. Extreme migraines, has hx of kidney infections with migraines but this is nonstop since 3/15. Vitamin E, D. Had not been taking iron.  Bleeding is stopped completely now 3/17, had some discharge at first.  no pain in uterus has been gone since around 3/15, is curious to see if has any products.  Has not been able to get to follow up.   Fevers off and on every day.  Sweaty, freezing. No dysuria. No continued discharge.   Only urinating twice a day.    Past Medical History:  Diagnosis Date   Anxiety    Depression    Pregnancy induced hypertension      Home Medications Prior to Admission medications   Medication Sig Start Date End Date Taking? Authorizing Provider  cephALEXin (KEFLEX) 500 MG capsule Take 1 capsule (500 mg total) by mouth 4 (four) times daily. 01/21/20   Larene Pickett, PA-C  docusate sodium (COLACE) 100 MG capsule Take 1 capsule (100 mg total) by mouth 2 (two) times daily as  needed. Patient not taking: Reported on 01/21/2020 06/11/16   Osborne Oman, MD  enalapril-hydrochlorothiazide (VASERETIC) 10-25 MG tablet Take 1 tablet by mouth daily. Patient not taking: Reported on 01/21/2020 06/11/16   Osborne Oman, MD  HYDROcodone-acetaminophen (NORCO/VICODIN) 5-325 MG tablet Take 1-2 tablets by mouth every 4 (four) hours as needed for moderate pain. Patient not taking: Reported on 01/21/2020 12/29/16   Orpah Greek, MD  hydrocortisone cream 1 % Apply to affected area 2 times daily Patient not taking: Reported on 01/21/2020 11/02/19   Henderly, Britni A, PA-C  ondansetron (ZOFRAN ODT) 4 MG disintegrating tablet Take 1 tablet (4 mg total) by mouth every 8 (eight) hours as needed for nausea. 01/21/20   Larene Pickett, PA-C  ondansetron (ZOFRAN) 4 MG tablet Take 1 tablet (4 mg total) by mouth every 6 (six) hours. Patient not taking: Reported on 01/21/2020 07/17/19   Charlann Lange, PA-C  oxyCODONE-acetaminophen (PERCOCET/ROXICET) 5-325 MG tablet Take 1-2 tablets by mouth every 6 (six) hours as needed for moderate pain. Use only if Reglan does not work Patient not taking: Reported on 01/21/2020 06/11/16   Osborne Oman, MD  phenazopyridine (PYRIDIUM) 200 MG tablet Take 1 tablet (200 mg total) by mouth 3 (three) times daily. 01/21/20   Larene Pickett, PA-C      Allergies    Morphine and related    Review of Systems   Review of Systems  Physical Exam Updated Vital  Signs BP 108/72   Pulse 92   Temp 99.1 F (37.3 C) (Oral)   Resp 20   SpO2 100%  Physical Exam Vitals and nursing note reviewed.  Constitutional:      General: She is not in acute distress.    Appearance: Normal appearance. She is well-developed. She is ill-appearing. She is not diaphoretic.     Comments: pale  HENT:     Head: Normocephalic and atraumatic.  Eyes:     General: No visual field deficit.    Extraocular Movements: Extraocular movements intact.     Conjunctiva/sclera: Conjunctivae  normal.     Pupils: Pupils are equal, round, and reactive to light.  Cardiovascular:     Rate and Rhythm: Regular rhythm. Tachycardia present.     Pulses: Normal pulses.     Heart sounds: Normal heart sounds. No murmur heard.    No friction rub. No gallop.  Pulmonary:     Effort: Pulmonary effort is normal. No respiratory distress.     Breath sounds: Normal breath sounds. No wheezing or rales.  Abdominal:     General: There is no distension.     Palpations: Abdomen is soft.     Tenderness: There is no abdominal tenderness. There is no guarding.  Musculoskeletal:        General: No swelling or tenderness.     Cervical back: Normal range of motion.  Skin:    General: Skin is warm and dry.     Findings: No erythema or rash.  Neurological:     General: No focal deficit present.     Mental Status: She is alert and oriented to person, place, and time.     GCS: GCS eye subscore is 4. GCS verbal subscore is 5. GCS motor subscore is 6.     Cranial Nerves: No cranial nerve deficit, dysarthria or facial asymmetry.     Sensory: No sensory deficit.     Motor: No weakness or tremor.     Coordination: Coordination normal. Finger-Nose-Finger Test normal.     Gait: Gait normal.     ED Results / Procedures / Treatments   Labs (all labs ordered are listed, but only abnormal results are displayed) Labs Reviewed  CBC WITH DIFFERENTIAL/PLATELET - Abnormal; Notable for the following components:      Result Value   RBC 2.06 (*)    Hemoglobin 5.5 (*)    HCT 17.8 (*)    Platelets 732 (*)    nRBC 0.4 (*)    All other components within normal limits  BASIC METABOLIC PANEL - Abnormal; Notable for the following components:   Glucose, Bld 109 (*)    All other components within normal limits  HCG, QUANTITATIVE, PREGNANCY - Abnormal; Notable for the following components:   hCG, Beta Chain, Quant, S 212 (*)    All other components within normal limits  CBC  PREPARE RBC (CROSSMATCH)  TYPE AND SCREEN   GC/CHLAMYDIA PROBE AMP ([place]) NOT AT [facility]    EKG EKG Interpretation  Date/Time:  Thursday February 09 2023 20:06:55 EDT Ventricular Rate:  132 PR Interval:  118 QRS Duration: 66 QT Interval:  282 QTC Calculation: 417 R Axis:   39 Text Interpretation: Sinus tachycardia Otherwise normal ECG No previous ECGs available Confirmed by Gareth Morgan [phone]) on 02/09/2023 9:43:38 PM  Radiology CT Head Wo Contrast  Result Date: 02/09/2023 CLINICAL DATA:  Head trauma EXAM: CT HEAD WITHOUT CONTRAST TECHNIQUE: Contiguous axial images were obtained from the base  of the skull through the vertex without intravenous contrast. RADIATION DOSE REDUCTION: This exam was performed according to the departmental dose-optimization program which includes automated exposure control, adjustment of the mA and/or kV according to patient size and/or use of iterative reconstruction technique. COMPARISON:  12/29/2016 FINDINGS: Brain: There is no mass, hemorrhage or extra-axial collection. The size and configuration of the ventricles and extra-axial CSF spaces are normal. The brain parenchyma is normal, without acute or chronic infarction. Vascular: No abnormal hyperdensity of the major intracranial arteries or dural venous sinuses. No intracranial atherosclerosis. Skull: The visualized skull base, calvarium and extracranial soft tissues are normal. Sinuses/Orbits: No fluid levels or advanced mucosal thickening of the visualized paranasal sinuses. No mastoid or middle ear effusion. The orbits are normal. IMPRESSION: Normal head CT. Electronically Signed   By: Ulyses Jarred M.D.   On: 02/09/2023 21:16    Procedures Procedures    Medications Ordered in ED Medications  ondansetron (ZOFRAN-ODT) disintegrating tablet 4 mg (has no administration in time range)  prenatal multivitamin tablet 1 tablet (has no administration in time range)  diphenhydrAMINE (BENADRYL) capsule 25 mg (has no administration in time range)   HYDROcodone-acetaminophen (NORCO/VICODIN) 5-325 MG per tablet 1-2 tablet (1 tablet Oral Given 02/10/23 1050)  furosemide (LASIX) injection 20 mg (has no administration in time range)  zolpidem (AMBIEN) tablet 5 mg (5 mg Oral Given 02/10/23 0143)  0.9 %  sodium chloride infusion (Manually program via Guardrails IV Fluids) ( Intravenous New Bag/Given 02/10/23 0805)  acetaminophen (TYLENOL) tablet 650 mg (650 mg Oral Given 02/10/23 0446)  diphenhydrAMINE (BENADRYL) capsule 25 mg (25 mg Oral Given 02/10/23 0446)  butalbital-acetaminophen-caffeine (FIORICET) 50-325-40 MG per tablet 1 tablet (1 tablet Oral Given 02/09/23 2330)  butalbital-acetaminophen-caffeine (FIORICET) 50-325-40 MG per tablet 2 tablet (2 tablets Oral Given 02/10/23 0757)    ED Course/ Medical Decision Making/ A&P                              32 year old G"number unknown"P4 with recent medical termination of pregnancy 3/9 at 9wk 2days presents with concern for vaginal bleeding weeks ago now improved, lightheadedness, fatigue, resulting in falls, head trauma, headache.  Regarding headache, head trauma: CT head completed and shows no sign of ICH. Discussed may be concussion related symptoms or migraines as she has had in past.  She can have follow up with PCP regarding this.   Labs completed and personally evaluated by me show hgb 5.5.  Has tachycardia to 130s, lightheadedness.  Reports she is no longer bleeding--suspect had significant bleeding and has had anemia for last 1-2 weeks.  Will need admission for symptomatic anemia. Discussed with Dr. Elonda Husky given recent medical termination without follow up, desire to obtain US and discuss location of care. Will admit for symptomatic anemia given vaginal bleeding secondary to termination as etiology.  They will evaluate and decide need for further work up.        Final Clinical Impression(s) / ED Diagnoses Final diagnoses:  Symptomatic anemia  Lightheadedness  Fall, initial encounter   Concussion without loss of consciousness, initial encounter  Complication after medical termination of pregnancy    Rx / DC Orders ED Discharge Orders     None         Gareth Morgan, MD 02/10/23 1224

## 2023-02-09 NOTE — ED Triage Notes (Signed)
Pt presents to ED POV. Pt c/o dizziness, falling, headaches, ringing in ear. Pt reports that on 3/15 she fell and hit her head twice. Pt reports that she had an abortion 3/10 via pills. She went home and took second pill and began large amounts of bleeding. Been feeling s/s ever since and unable to get to hospital

## 2023-02-09 NOTE — H&P (Signed)
Preoperative History and Physical  [name] [name] is a 32 y.o. [id number] with No LMP recorded. admitted for a symptomatic anemia.   9wk 2 days twins 3/9, medications   Started having severe pain, contractions Severe bleeding 3/10-14, soaking more than 2 pads an hour Couldn't get to Dr. At first 2 weeks ago Thursday drove to clinic, saw her said anemic, dehydrated, weawk, vomiting, given ibuprofen, nothing working for pain Lower abdominal pain Then next morning fell down, hit head, almost passed out Ringing in ears, sounds like someone beating on metal building, hit left side. Nausea. Numbness bilat finger tips, middle fingers. Extreme migraines, has hx of kidney infections with migraines but this is nonstop since 3/15. Vitamin E, D. Had not been taking iron.   Bleeding is stopped completely now 3/17, had some discharge at first.  no pain in uterus has been gone since around 3/15, is curious to see if has any products.  Has not been able to get to follow up.   Fevers off and on every day.  Sweaty, freezing. No dysuria. No continued discharge.   Only urinating twice a day. PMH:    Past Medical History:  Diagnosis Date   Anxiety    Depression    Pregnancy induced hypertension     PSH:     Past Surgical History:  Procedure Laterality Date   CESAREAN SECTION     CESAREAN SECTION N/A 06/08/2016   Procedure: CESAREAN SECTION;  Surgeon: Aletha Halim, MD;  Location: [facility];  Service: Obstetrics;  Laterality: N/A;    POb/GynH:      OB History     Gravida  7   Para  4   Term  3   Preterm  1   AB  2   Living  4      SAB  2   IAB      Ectopic      Multiple  0   Live Births  4           SH:   Social History   Tobacco Use   Smoking status: Some Days    Packs/day: .25    Types: Cigarettes   Smokeless tobacco: Never  Substance Use Topics   Alcohol use: Yes   Drug use: No    FH:    Family History  Problem Relation Age of Onset    Mental illness Father    Depression Father    Drug abuse Father    Early death Father    Hearing loss Mother    Arthritis Maternal Aunt    Hearing loss Maternal Aunt    COPD Maternal Uncle    Arthritis Paternal Grandmother      Allergies:  Allergies  Allergen Reactions   Morphine And Related Itching and Nausea And Vomiting    Medications:       Current Facility-Administered Medications:    0.9 %  sodium chloride infusion (Manually program via Guardrails IV Fluids), , Intravenous, Once, [place] Stare, Mertie Clause, MD   acetaminophen (TYLENOL) tablet 650 mg, 650 mg, Oral, Once, Lonnetta Kniskern, Mertie Clause, MD   diphenhydrAMINE (BENADRYL) capsule 25 mg, 25 mg, Oral, Q4H PRN, Florian Buff, MD   diphenhydrAMINE (BENADRYL) capsule 25 mg, 25 mg, Oral, Once, Florian Buff, MD   furosemide (LASIX) injection 20 mg, 20 mg, Intravenous, Once, Florian Buff, MD   HYDROcodone-acetaminophen (NORCO/VICODIN) 5-325 MG per tablet 1-2 tablet, 1-2 tablet, Oral, Q4H PRN, Florian Buff, MD  ondansetron (ZOFRAN-ODT) disintegrating tablet 4 mg, 4 mg, Oral, Q8H PRN, Florian Buff, MD   [START ON 02/10/2023] prenatal multivitamin tablet 1 tablet, 1 tablet, Oral, Q1200, Florian Buff, MD  Review of Systems:   Review of Systems  Constitutional: Negative for fever, chills, weight loss, malaise/fatigue and diaphoresis.  HENT: Negative for hearing loss, ear pain, nosebleeds, congestion, sore throat, neck pain, tinnitus and ear discharge.   Eyes: Negative for blurred vision, double vision, photophobia, pain, discharge and redness.  Respiratory: Negative for cough, hemoptysis, sputum production, shortness of breath, wheezing and stridor.   Cardiovascular: Negative for chest pain, palpitations, orthopnea, claudication, leg swelling and PND.  Gastrointestinal: Positive for abdominal pain. Negative for heartburn, nausea, vomiting, diarrhea, constipation, blood in stool and melena.  Genitourinary: Negative for dysuria, urgency,  frequency, hematuria and flank pain.  Musculoskeletal: Negative for myalgias, back pain, joint pain and falls.  Skin: Negative for itching and rash.  Neurological: Negative for dizziness, tingling, tremors, sensory change, speech change, focal weakness, seizures, loss of consciousness, weakness and headaches.  Endo/Heme/Allergies: Negative for environmental allergies and polydipsia. Does not bruise/bleed easily.  Psychiatric/Behavioral: Negative for depression, suicidal ideas, hallucinations, memory loss and substance abuse. The patient is not nervous/anxious and does not have insomnia.      PHYSICAL EXAM:  Blood pressure 128/71, pulse (!) 120, temperature 98.6 F (37 C), temperature source Oral, resp. rate 19, SpO2 100 %, unknown if currently breastfeeding.    Vitals reviewed. Constitutional: She is oriented to person, place, and time. She appears well-developed and well-nourished.  HENT:  Head: Normocephalic and atraumatic.  Right Ear: External ear normal.  Left Ear: External ear normal.  Nose: Nose normal.  Mouth/Throat: Oropharynx is clear and moist.  Eyes: Conjunctivae and EOM are normal. Pupils are equal, round, and reactive to light. Right eye exhibits no discharge. Left eye exhibits no discharge. No scleral icterus.  Neck: Normal range of motion. Neck supple. No tracheal deviation present. No thyromegaly present.  Cardiovascular: Normal rate, regular rhythm, normal heart sounds and intact distal pulses.  Exam reveals no gallop and no friction rub.   No murmur heard. Respiratory: Effort normal and breath sounds normal. No respiratory distress. She has no wheezes. She has no rales. She exhibits no tenderness.  GI: Soft. Bowel sounds are normal. She exhibits no distension and no mass. There is tenderness. There is no rebound and no guarding.  Genitourinary:       Vulva is normal without lesions Vagina is pink moist without discharge Cervix normal in appearance and pap is  normal Uterus is normal size, contour, position, consistency, mobility, non-tender Adnexa is negative with normal sized ovaries by sonogram  Musculoskeletal: Normal range of motion. She exhibits no edema and no tenderness.  Neurological: She is alert and oriented to person, place, and time. She has normal reflexes. She displays normal reflexes. No cranial nerve deficit. She exhibits normal muscle tone. Coordination normal.  Skin: Skin is warm and dry. No rash noted. No erythema. No pallor.  Psychiatric: She has a normal mood and affect. Her behavior is normal. Judgment and thought content normal.    Labs: Results for orders placed or performed during the hospital encounter of 02/09/23 (from the past 336 hour(s))  CBC with Differential   Collection Time: 02/09/23  8:12 PM  Result Value Ref Range   WBC 7.0 4.0 - 10.5 K/uL   RBC 2.06 (L) 3.87 - 5.11 MIL/uL   Hemoglobin 5.5 (LL) 12.0 - 15.0 g/dL  HCT 17.8 (L) 36.0 - 46.0 %   MCV 86.4 80.0 - 100.0 fL   MCH 26.7 26.0 - 34.0 pg   MCHC 30.9 30.0 - 36.0 g/dL   RDW 15.2 11.5 - 15.5 %   Platelets 732 (H) 150 - 400 K/uL   nRBC 0.4 (H) 0.0 - 0.2 %   Neutrophils Relative % 57 %   Neutro Abs 4.0 1.7 - 7.7 K/uL   Lymphocytes Relative 30 %   Lymphs Abs 2.1 0.7 - 4.0 K/uL   Monocytes Relative 9 %   Monocytes Absolute 0.6 0.1 - 1.0 K/uL   Eosinophils Relative 3 %   Eosinophils Absolute 0.2 0.0 - 0.5 K/uL   Basophils Relative 0 %   Basophils Absolute 0.0 0.0 - 0.1 K/uL   Immature Granulocytes 1 %   Abs Immature Granulocytes 0.04 0.00 - 0.07 K/uL  Basic metabolic panel   Collection Time: 02/09/23  8:12 PM  Result Value Ref Range   Sodium 136 135 - 145 mmol/L   Potassium 3.7 3.5 - 5.1 mmol/L   Chloride 103 98 - 111 mmol/L   CO2 22 22 - 32 mmol/L   Glucose, Bld 109 (H) 70 - 99 mg/dL   BUN 6 6 - 20 mg/dL   Creatinine, Ser 0.49 0.44 - 1.00 mg/dL   Calcium 9.2 8.9 - 10.3 mg/dL   GFR, Estimated >60 >60 mL/min   Anion gap 11 5 - 15  hCG,  quantitative, pregnancy   Collection Time: 02/09/23  8:12 PM  Result Value Ref Range   hCG, Beta Chain, Quant, S 212 (H) <5 mIU/mL    EKG: Orders placed or performed during the hospital encounter of 02/09/23   EKG 12-Lead   EKG 12-Lead   ED EKG   ED EKG    Imaging Studies: CT Head Wo Contrast  Result Date: 02/09/2023 CLINICAL DATA:  Head trauma EXAM: CT HEAD WITHOUT CONTRAST TECHNIQUE: Contiguous axial images were obtained from the base of the skull through the vertex without intravenous contrast. RADIATION DOSE REDUCTION: This exam was performed according to the departmental dose-optimization program which includes automated exposure control, adjustment of the mA and/or kV according to patient size and/or use of iterative reconstruction technique. COMPARISON:  12/29/2016 FINDINGS: Brain: There is no mass, hemorrhage or extra-axial collection. The size and configuration of the ventricles and extra-axial CSF spaces are normal. The brain parenchyma is normal, without acute or chronic infarction. Vascular: No abnormal hyperdensity of the major intracranial arteries or dural venous sinuses. No intracranial atherosclerosis. Skull: The visualized skull base, calvarium and extracranial soft tissues are normal. Sinuses/Orbits: No fluid levels or advanced mucosal thickening of the visualized paranasal sinuses. No mastoid or middle ear effusion. The orbits are normal. IMPRESSION: Normal head CT. Electronically Signed   By: Ulyses Jarred M.D.   On: 02/09/2023 21:16      Assessment: S/P elective termination, 01/21/23, medical, with heavy bleeding(none for 11 days Symptomatic anemia  Plan: Transfuse 2 units PRBC  Florian Buff 02/09/2023 10:29 PM

## 2023-02-10 ENCOUNTER — Observation Stay (HOSPITAL_COMMUNITY): Payer: Medicaid Other

## 2023-02-10 ENCOUNTER — Observation Stay (HOSPITAL_BASED_OUTPATIENT_CLINIC_OR_DEPARTMENT_OTHER): Payer: Medicaid Other | Admitting: Anesthesiology

## 2023-02-10 ENCOUNTER — Observation Stay (HOSPITAL_COMMUNITY): Payer: Medicaid Other | Admitting: Anesthesiology

## 2023-02-10 ENCOUNTER — Other Ambulatory Visit: Payer: Self-pay

## 2023-02-10 ENCOUNTER — Encounter (HOSPITAL_COMMUNITY): Payer: Self-pay | Admitting: Obstetrics & Gynecology

## 2023-02-10 ENCOUNTER — Encounter (HOSPITAL_COMMUNITY)
Admission: EM | Disposition: A | Discharge status: Home / Self Care | Payer: Self-pay | Source: Home / Self Care | Attending: Emergency Medicine

## 2023-02-10 DIAGNOSIS — Z3A09 9 weeks gestation of pregnancy: Secondary | ICD-10-CM | POA: Diagnosis not present

## 2023-02-10 DIAGNOSIS — O021 Missed abortion: Secondary | ICD-10-CM | POA: Diagnosis not present

## 2023-02-10 DIAGNOSIS — Z3A08 8 weeks gestation of pregnancy: Secondary | ICD-10-CM

## 2023-02-10 DIAGNOSIS — D649 Anemia, unspecified: Secondary | ICD-10-CM | POA: Diagnosis not present

## 2023-02-10 DIAGNOSIS — O034 Incomplete spontaneous abortion without complication: Secondary | ICD-10-CM

## 2023-02-10 HISTORY — PX: DILATION AND CURETTAGE OF UTERUS: SHX78

## 2023-02-10 LAB — CBC
HCT: 25.3 % — ABNORMAL LOW (ref 36.0–46.0)
Hemoglobin: 8.1 g/dL — ABNORMAL LOW (ref 12.0–15.0)
MCH: 28.4 pg (ref 26.0–34.0)
MCHC: 32 g/dL (ref 30.0–36.0)
MCV: 88.8 fL (ref 80.0–100.0)
Platelets: 594 10*3/uL — ABNORMAL HIGH (ref 150–400)
RBC: 2.85 MIL/uL — ABNORMAL LOW (ref 3.87–5.11)
RDW: 14.7 % (ref 11.5–15.5)
WBC: 6 10*3/uL (ref 4.0–10.5)
nRBC: 0.5 % — ABNORMAL HIGH (ref 0.0–0.2)

## 2023-02-10 SURGERY — DILATION AND CURETTAGE
Anesthesia: General | Site: Vagina

## 2023-02-10 MED ORDER — DOXYCYCLINE HYCLATE 100 MG IV SOLR
200.0000 mg | INTRAVENOUS | Status: DC
  Filled 2023-02-10: qty 200

## 2023-02-10 MED ORDER — BUPIVACAINE HCL 0.25 % IJ SOLN
INTRAMUSCULAR | Status: DC | PRN
  Administered 2023-02-10: 10 mL

## 2023-02-10 MED ORDER — KETOROLAC TROMETHAMINE 30 MG/ML IJ SOLN
INTRAMUSCULAR | Status: DC | PRN
  Administered 2023-02-10: 30 mg via INTRAVENOUS

## 2023-02-10 MED ORDER — ZOLPIDEM TARTRATE 5 MG PO TABS
5.0000 mg | ORAL_TABLET | Freq: Every evening | ORAL | Status: DC | PRN
  Administered 2023-02-10: 5 mg via ORAL
  Filled 2023-02-10: qty 1

## 2023-02-10 MED ORDER — BUTALBITAL-APAP-CAFFEINE 50-325-40 MG PO TABS
2.0000 | ORAL_TABLET | Freq: Once | ORAL | Status: AC
  Administered 2023-02-10: 2 via ORAL
  Filled 2023-02-10: qty 2

## 2023-02-10 MED ORDER — MIDAZOLAM HCL 2 MG/2ML IJ SOLN
INTRAMUSCULAR | Status: AC
  Filled 2023-02-10: qty 2

## 2023-02-10 MED ORDER — BUPIVACAINE HCL (PF) 0.25 % IJ SOLN
INTRAMUSCULAR | Status: AC
  Filled 2023-02-10: qty 30

## 2023-02-10 MED ORDER — LACTATED RINGERS IV SOLN: INTRAVENOUS | Status: DC

## 2023-02-10 MED ORDER — MISOPROSTOL 200 MCG PO TABS
ORAL_TABLET | ORAL | Status: AC
  Filled 2023-02-10: qty 1

## 2023-02-10 MED ORDER — DOXYCYCLINE HYCLATE 100 MG IV SOLR
200.0000 mg | Freq: Once | INTRAVENOUS | Status: AC
  Administered 2023-02-10: 200 mg via INTRAVENOUS
  Filled 2023-02-10: qty 200

## 2023-02-10 MED ORDER — 0.9 % SODIUM CHLORIDE (POUR BTL) OPTIME
TOPICAL | Status: DC | PRN
  Administered 2023-02-10: 1000 mL

## 2023-02-10 MED ORDER — FENTANYL CITRATE (PF) 100 MCG/2ML IJ SOLN: 25.0000 ug | INTRAMUSCULAR | Status: DC | PRN

## 2023-02-10 MED ORDER — FENTANYL CITRATE (PF) 250 MCG/5ML IJ SOLN
INTRAMUSCULAR | Status: DC | PRN
  Administered 2023-02-10 (×2): 50 ug via INTRAVENOUS

## 2023-02-10 MED ORDER — MIDAZOLAM HCL 2 MG/2ML IJ SOLN
INTRAMUSCULAR | Status: DC | PRN
  Administered 2023-02-10 (×2): 1 mg via INTRAVENOUS

## 2023-02-10 MED ORDER — PROPOFOL 10 MG/ML IV BOLUS
INTRAVENOUS | Status: AC
  Filled 2023-02-10: qty 20

## 2023-02-10 MED ORDER — LIDOCAINE HCL (CARDIAC) PF 100 MG/5ML IV SOSY
PREFILLED_SYRINGE | INTRAVENOUS | Status: DC | PRN
  Administered 2023-02-10: 60 mg via INTRAVENOUS

## 2023-02-10 MED ORDER — ONDANSETRON HCL 4 MG/2ML IJ SOLN
INTRAMUSCULAR | Status: DC | PRN
  Administered 2023-02-10: 4 mg via INTRAVENOUS

## 2023-02-10 MED ORDER — PROPOFOL 10 MG/ML IV BOLUS
INTRAVENOUS | Status: DC | PRN
  Administered 2023-02-10: 130 mg via INTRAVENOUS

## 2023-02-10 MED ORDER — MISOPROSTOL 100 MCG PO TABS
ORAL_TABLET | ORAL | Status: DC | PRN
  Administered 2023-02-10: 100 ug

## 2023-02-10 MED ORDER — SOD CITRATE-CITRIC ACID 500-334 MG/5ML PO SOLN
30.0000 mL | ORAL | Status: AC
  Administered 2023-02-10: 30 mL via ORAL
  Filled 2023-02-10: qty 30

## 2023-02-10 MED ORDER — CHLORHEXIDINE GLUCONATE 0.12 % MT SOLN
15.0000 mL | Freq: Once | OROMUCOSAL | Status: AC
  Administered 2023-02-10: 15 mL via OROMUCOSAL
  Filled 2023-02-10: qty 15

## 2023-02-10 MED ORDER — ONDANSETRON HCL 4 MG/2ML IJ SOLN
INTRAMUSCULAR | Status: AC
  Filled 2023-02-10: qty 2

## 2023-02-10 MED ORDER — HYDROMORPHONE HCL 1 MG/ML IJ SOLN
INTRAMUSCULAR | Status: AC
  Filled 2023-02-10: qty 1

## 2023-02-10 MED ORDER — KETOROLAC TROMETHAMINE 30 MG/ML IJ SOLN
30.0000 mg | Freq: Four times a day (QID) | INTRAMUSCULAR | Status: DC
  Administered 2023-02-11 (×2): 30 mg via INTRAVENOUS
  Filled 2023-02-10 (×3): qty 1

## 2023-02-10 MED ORDER — FENTANYL CITRATE (PF) 250 MCG/5ML IJ SOLN
INTRAMUSCULAR | Status: AC
  Filled 2023-02-10: qty 5

## 2023-02-10 MED ORDER — HYDROMORPHONE HCL 1 MG/ML IJ SOLN
0.2500 mg | INTRAMUSCULAR | Status: DC | PRN
  Administered 2023-02-10 (×2): 0.5 mg via INTRAVENOUS

## 2023-02-10 MED ORDER — IBUPROFEN 800 MG PO TABS
800.0000 mg | ORAL_TABLET | Freq: Three times a day (TID) | ORAL | Status: DC
  Administered 2023-02-11: 800 mg via ORAL
  Filled 2023-02-10 (×2): qty 1

## 2023-02-10 MED ORDER — DEXMEDETOMIDINE HCL IN NACL 80 MCG/20ML IV SOLN
INTRAVENOUS | Status: DC | PRN
  Administered 2023-02-10: 8 ug via INTRAVENOUS

## 2023-02-10 MED ORDER — ORAL CARE MOUTH RINSE: 15.0000 mL | Freq: Once | OROMUCOSAL | Status: AC

## 2023-02-10 MED ORDER — LORAZEPAM 1 MG PO TABS
1.0000 mg | ORAL_TABLET | Freq: Four times a day (QID) | ORAL | Status: DC | PRN
  Administered 2023-02-10: 1 mg via ORAL
  Filled 2023-02-10: qty 1

## 2023-02-10 SURGICAL SUPPLY — 16 items
CANISTER SUCT 3000ML PPV (MISCELLANEOUS) ×1 IMPLANT
CATH ROBINSON RED A/P 16FR (CATHETERS) ×1 IMPLANT
CNTNR URN SCR LID CUP LEK RST (MISCELLANEOUS) ×1 IMPLANT
CONT SPEC 4OZ STRL OR WHT (MISCELLANEOUS)
GLOVE BIO SURGEON STRL SZ7.5 (GLOVE) ×1 IMPLANT
GLOVE BIOGEL PI IND STRL 7.0 (GLOVE) ×1 IMPLANT
GOWN STRL REUS W/ TWL LRG LVL3 (GOWN DISPOSABLE) ×1 IMPLANT
GOWN STRL REUS W/ TWL XL LVL3 (GOWN DISPOSABLE) ×1 IMPLANT
GOWN STRL REUS W/TWL LRG LVL3 (GOWN DISPOSABLE) ×1
GOWN STRL REUS W/TWL XL LVL3 (GOWN DISPOSABLE) ×1
KIT TURNOVER KIT B (KITS) ×1 IMPLANT
PACK VAGINAL MINOR WOMEN LF (CUSTOM PROCEDURE TRAY) ×1 IMPLANT
PAD OB MATERNITY 4.3X12.25 (PERSONAL CARE ITEMS) ×1 IMPLANT
TOWEL GREEN STERILE FF (TOWEL DISPOSABLE) ×2 IMPLANT
UNDERPAD 30X36 HEAVY ABSORB (UNDERPADS AND DIAPERS) ×1 IMPLANT
VACURETTE 8MM F TIP (MISCELLANEOUS) IMPLANT

## 2023-02-10 NOTE — Progress Notes (Signed)
  HD # 1 for anemia following medically induced AB  Subjective: Patient reports still feeling tired and mild HA. Receiving second unit of PRBC's now  Objective: AF VSS   Lungs clear Heart RRR Abd soft + BS GU deferred  U/S retained POC's   Assessment/Plan: Retained POC's Anemia  U/S results reviewed with pt. Tx options discussed. Pt desires to proceed with Suction D & C. R/B/Post op care reviewed. Will make NPO now. Pt just ate a full meal. Complete blood transfusion. To OR @ 1800.   LOS: 0 days    Chancy Milroy, MD 02/10/2023, 10:22 AM

## 2023-02-10 NOTE — Anesthesia Preprocedure Evaluation (Addendum)
Anesthesia Evaluation  Patient identified by MRN, date of birth, ID band Patient awake    Reviewed: Allergy & Precautions, H&P , NPO status , Patient's Chart, lab work & pertinent test results  Airway Mallampati: II  TM Distance: >3 FB Neck ROM: Full    Dental no notable dental hx. (+) Teeth Intact, Dental Advisory Given   Pulmonary Current Smoker and Patient abstained from smoking.   Pulmonary exam normal breath sounds clear to auscultation       Cardiovascular hypertension, Pt. on medications  Rhythm:Regular Rate:Normal     Neuro/Psych   Anxiety Depression    negative neurological ROS     GI/Hepatic negative GI ROS, Neg liver ROS,,,  Endo/Other  negative endocrine ROS    Renal/GU negative Renal ROS  negative genitourinary   Musculoskeletal   Abdominal   Peds  Hematology  (+) Blood dyscrasia, anemia   Anesthesia Other Findings   Reproductive/Obstetrics negative OB ROS                             Anesthesia Physical Anesthesia Plan  ASA: 2  Anesthesia Plan: General   Post-op Pain Management: Tylenol PO (pre-op)* and Toradol IV (intra-op)*   Induction: Intravenous  PONV Risk Score and Plan: 3 and Ondansetron, Dexamethasone and Midazolam  Airway Management Planned: LMA and Oral ETT  Additional Equipment:   Intra-op Plan:   Post-operative Plan: Extubation in OR  Informed Consent: I have reviewed the patients History and Physical, chart, labs and discussed the procedure including the risks, benefits and alternatives for the proposed anesthesia with the patient or authorized representative who has indicated his/her understanding and acceptance.     Dental advisory given  Plan Discussed with: CRNA  Anesthesia Plan Comments:        Anesthesia Quick Evaluation

## 2023-02-10 NOTE — Anesthesia Postprocedure Evaluation (Signed)
Anesthesia Post Note  Patient: [name] [name]  Procedure(s) Performed: DILATATION AND CURETTAGE FOR RETAINED PRODUCTS OF CONCEPTION (Vagina )     Patient location during evaluation: PACU Anesthesia Type: General Level of consciousness: awake and alert Pain management: pain level controlled Vital Signs Assessment: post-procedure vital signs reviewed and stable Respiratory status: spontaneous breathing, nonlabored ventilation and respiratory function stable Cardiovascular status: blood pressure returned to baseline and stable Postop Assessment: no apparent nausea or vomiting Anesthetic complications: no  No notable events documented.  Last Vitals:  Vitals:   02/10/23 1945 02/10/23 2002  BP: 108/70 126/79  Pulse: 96 79  Resp: (!) 21 20  Temp: 36.9 C 36.8 C  SpO2: 100% 97%    Last Pain:  Vitals:   02/10/23 2002  TempSrc: Oral  PainSc:                  Debar Plate,W. EDMOND

## 2023-02-10 NOTE — Anesthesia Procedure Notes (Signed)
Procedure Name: LMA Insertion Date/Time: 02/10/2023 6:29 PM  Performed by: Flossie Dibble, CRNAPre-anesthesia Checklist: Patient identified, Patient being monitored, Emergency Drugs available, Timeout performed and Suction available Patient Re-evaluated:Patient Re-evaluated prior to induction Oxygen Delivery Method: Circle System Utilized Preoxygenation: Pre-oxygenation with 100% oxygen Induction Type: IV induction Ventilation: Mask ventilation without difficulty LMA: LMA inserted LMA Size: 4.0 Number of attempts: 1 Placement Confirmation: positive ETCO2 and breath sounds checked- equal and bilateral Tube secured with: Tape Dental Injury: Teeth and Oropharynx as per pre-operative assessment

## 2023-02-10 NOTE — Transfer of Care (Signed)
Immediate Anesthesia Transfer of Care Note  Patient: [name] [name]  Procedure(s) Performed: DILATATION AND CURETTAGE FOR RETAINED PRODUCTS OF CONCEPTION (Vagina )  Patient Location: PACU  Anesthesia Type:General  Level of Consciousness: awake, alert , and oriented  Airway & Oxygen Therapy: Patient Spontanous Breathing  Post-op Assessment: Report given to RN, Post -op Vital signs reviewed and stable, and Patient moving all extremities X 4  Post vital signs: Reviewed and stable  Last Vitals:  Vitals Value Taken Time  BP 129/75 02/10/23 1900  Temp    Pulse 96 02/10/23 1900  Resp 17 02/10/23 1900  SpO2 100 % 02/10/23 1900  Vitals shown include unvalidated device data.  Last Pain:  Vitals:   02/10/23 1739  TempSrc: Oral  PainSc: 0-No pain         Complications: No notable events documented.

## 2023-02-10 NOTE — Op Note (Signed)
DASHARA SOLARZ PROCEDURE DATE: 02/10/2023  PREOPERATIVE DIAGNOSIS: Retained products of conception POSTOPERATIVE DIAGNOSIS: The same PROCEDURE:     Dilation and Evacuation SURGEON:  Dr. Arlina Robes  INDICATIONS: 32 y.o. [id number] with retained POC , needing surgical completion.  Risks of surgery were discussed with the patient including but not limited to: bleeding which may require transfusion; infection which may require antibiotics; injury to uterus or surrounding organs; need for additional procedures including laparotomy or laparoscopy; possibility of intrauterine scarring which may impair future fertility; and other postoperative/anesthesia complications. Written informed consent was obtained.    FINDINGS:  A 8 week size uterus, products of conception, specimen sent to pathology.  ANESTHESIA:    Monitored intravenous sedation, paracervical block. INTRAVENOUS FLUIDS:  As recorded ESTIMATED BLOOD LOSS:  Less than 20 ml. SPECIMENS:  Products of conception sent to pathology COMPLICATIONS:  None immediate.  PROCEDURE DETAILS:  The patient received intravenous Doxycycline while in the preoperative area.  She was then taken to the operating room where monitored intravenous sedation was administered and was found to be adequate.  After an adequate timeout was performed, she was placed in the dorsal lithotomy position and examined; then prepped and draped in the sterile manner.   Her bladder was catheterized for an unmeasured amount of clear, yellow urine. A vaginal speculum was then placed in the patient's vagina and a single tooth tenaculum was applied to the anterior lip of the cervix.  A paracervical block using 30 ml of 0.5% Marcaine was administered. The cervix was gently dilated to accommodate a 8 mm suction curette that was gently advanced to the uterine fundus.  The suction device was then activated and curette slowly rotated to clear the uterus of products of conception.  A sharp  curettage was then performed to confirm complete emptying of the uterus. There was minimal bleeding noted and the tenaculum removed with good hemostasis noted.   All instruments were removed from the patient's vagina.  Sponge and instrument counts were correct times two  The patient tolerated the procedure well and was taken to the recovery area awake, and in stable condition.    Arlina Robes MD, [place] Attending [place], [facility]

## 2023-02-10 NOTE — Discharge Instructions (Signed)
D&E (Dilation and Evacuation) Dilation and evacuation (D&E) is a minor operation. It involves stretching (dilation) the cervix and evacuation of the uterus. During the procedure, the cervix is dilated and tissue is gently suctioned from the inside of the uterus.  REASONS FOR DOING D&E Removal of retained placenta after giving birth.  Abortion. Miscarriage.  RISKS AND COMPLICATIONS Putting a hole (perforation) in the uterus. Excessive bleeding after the D&E.  Infection of the uterus.  Damage to the cervix.  Developing scar tissue (adhesions) inside the uterus, later causing abnormal bleeding or no monthly bleeding (amenorrhea) or problems with fertility. Complications from general or local anesthetic.     PROCEDURE You may be given a drug to make you sleep (general anesthetic) or a drug that numbs the area (local anesthetic) in and around the cervix.  You will lie on your back with your legs in stirrups.  A curved tool (suction curette) will be used to evacuate the uterus and will then be removed.  This usually takes around 15 to 30 minutes.  AFTER THE PROCEDURE You will rest in the recovery room until you are stable and feel ready to go home.  You may feel sick to your stomach (nauseous) or throw up (vomit) if you had general anesthesia.  You may have light cramping and bleeding for a couple days to 2 weeks after the procedure.  Your uterus needs to make new lining after a D&E. This may make your next period late.   HOME CARE INSTRUCTIONS Do not drive for 24 hours.  Wait 1 week before returning to strenuous activities.  You may resume your usual diet.  Drink enough water and fluids to keep your urine clear or pale yellow.  You should return to your usual bowel function. If constipation occurs, you may:  Take a mild laxative with permission from your caregiver.  Add fruit and bran to your diet.  Take showers instead of baths for two weeks Do not go swimming or use a hot tub until  your caregiver gives you permission.  Have someone with you or available for you the first 24 to 48 hours, especially if you had a general anesthetic.  Do not douche, use tampons, or have intercourse until after your follow-up appointment, or when your caregiver approves.  Only take over-the-counter or prescription medicines for pain, discomfort, or fever as directed by your caregiver. Do not take aspirin. It can cause bleeding.  If a prescription has been given to you, follow your caregiver's directions. You may be given a medicine that kills germs (antibiotic) to prevent an infection.  Keep all your follow-up appointments recommended by your caregiver.   SEEK MEDICAL CARE IF: You have increasing cramps or pain not relieved with medicine.  You develop belly (abdominal) pain, which does not seem to be related to the same area as your earlier cramping and pain.  You feel dizzy or feel like fainting.  You have a bad smelling vaginal discharge.  You develop a rash.  You develop a reaction or allergy to your medicine.   SEEK IMMEDIATE MEDICAL CARE IF: Bleeding is heavier than a normal menstrual period.  You have an oral temperature above 101F, not controlled by medicine.  You develop chest pain.  You develop shortness of breath.  You pass out.  You develop heavy vaginal bleeding with or without blood clots.   MAKE SURE YOU: Understand these instructions.  Will watch your condition.  Will get help right away if you are   not doing well or get worse.   UPDATED HEALTH PRACTICES A Pap smear is done to screen for cervical cancer.  The first Pap smear should be done at age 21.  Between ages 21 and 29, Pap smears are repeated every 2 years.  Beginning at age 30, you are advised to have a Pap smear every 3 years as long as your past 3 Pap smears have been normal.  Some women have medical problems that increase the chance of getting cervical cancer. Talk to your caregiver about these problems. It  is especially important to talk to your caregiver if a new problem develops soon after your last Pap smear. In these cases, your caregiver may recommend more frequent screening and Pap smears.  The above recommendations are the same for women who have or have not gotten the vaccine for HPV (human papillomavirus).  If you had a uterus removal (hysterectomy) for a problem that was not a cancer or a condition that could lead to cancer, then you no longer need Pap smears.  If you are between ages 65 and 70, and you have had normal Pap smears going back 10 years, you no longer need Pap smears.  If you have had past treatment for cervical cancer or a condition that could lead to cancer, you need Pap smears and screening for cancer for at least 20 years after your treatment.  Continue monthly breast self-examinations. Your caregiver can provide information and instructions for breast self-examination.  [employer] Patient Information 2011 [employer].  

## 2023-02-11 ENCOUNTER — Encounter (HOSPITAL_COMMUNITY): Payer: Self-pay | Admitting: Obstetrics and Gynecology

## 2023-02-11 LAB — BPAM RBC
Blood Product Expiration Date: 202404242359
Blood Product Expiration Date: 202404242359
ISSUE DATE / TIME: 202403290442
ISSUE DATE / TIME: 202403290754
Unit Type and Rh: 5100
Unit Type and Rh: 5100

## 2023-02-11 LAB — TYPE AND SCREEN
ABO/RH(D): O POS
Antibody Screen: NEGATIVE
Unit division: 0
Unit division: 0

## 2023-02-11 MED ORDER — ONDANSETRON HCL 4 MG PO TABS: 4.0000 mg | ORAL_TABLET | Freq: Four times a day (QID) | ORAL | Status: DC | PRN

## 2023-02-11 MED ORDER — HYDROMORPHONE HCL 1 MG/ML IJ SOLN: 1.0000 mg | INTRAMUSCULAR | Status: DC | PRN

## 2023-02-11 MED ORDER — LACTATED RINGERS IV SOLN: INTRAVENOUS | Status: DC

## 2023-02-11 MED ORDER — ZOLPIDEM TARTRATE 5 MG PO TABS: 5.0000 mg | ORAL_TABLET | Freq: Every evening | ORAL | Status: DC | PRN

## 2023-02-11 MED ORDER — OXYCODONE-ACETAMINOPHEN 5-325 MG PO TABS: 1.0000 | ORAL_TABLET | ORAL | Status: DC | PRN

## 2023-02-11 MED ORDER — IBUPROFEN 800 MG PO TABS: 800.0000 mg | ORAL_TABLET | Freq: Three times a day (TID) | ORAL | 0 refills | Status: AC

## 2023-02-11 MED ORDER — OXYCODONE-ACETAMINOPHEN 5-325 MG PO TABS: 1.0000 | ORAL_TABLET | ORAL | 0 refills | Status: AC | PRN

## 2023-02-11 MED ORDER — ONDANSETRON HCL 4 MG/2ML IJ SOLN: 4.0000 mg | Freq: Four times a day (QID) | INTRAMUSCULAR | Status: DC | PRN

## 2023-02-11 MED ORDER — SIMETHICONE 80 MG PO CHEW: 80.0000 mg | CHEWABLE_TABLET | Freq: Four times a day (QID) | ORAL | Status: DC | PRN

## 2023-02-11 MED ORDER — OXYCODONE-ACETAMINOPHEN 5-325 MG PO TABS: 2.0000 | ORAL_TABLET | ORAL | Status: DC | PRN
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
## 2023-02-11 NOTE — Discharge Summary (Signed)
Physician Discharge Summary  Patient ID: [name] [name] MRN: [id number] DOB/AGE: [birth year] 32 y.o.  Admit date: 02/09/2023 Discharge date: 02/11/2023  Admission Diagnoses: Symptomatic anemia and retained products of conception. Mild concustion  Discharge Diagnoses:  Principal Problem:   Symptomatic anemia   Discharged Condition: good  Hospital Course: [name] was admitted with above Dx. See admit H & P for additional information. She received a blood trans fusion with good response to her H & H. U/S showed probable retained POCS. She underwent suction D & C on 02/10/23 without problems. See OP note for additional information. Her post op course was unremarkable. She progress to ambulating without problems, voiding, + flatus, tolerating diet and good oral pain control. Felt amendable for discharge home. Discharge instructions, medications and follow up reviewed with pt. Pt verbalized understanding.   Consults: None  Significant Diagnostic Studies: labs and radiology: CT scan:   and Ultrasound:    Treatments: IV hydration, antibiotics: surgery: Suction D & C, and blood transfusion  Discharge Exam: Blood pressure 115/70, pulse 87, temperature 98.6 F (37 C), temperature source Oral, resp. rate 18, SpO2 98 %, unknown if currently breastfeeding. Lungs clear Heart RRR Abd soft + BS GU scan bleeding Ext non tender  Disposition: Discharge disposition: 01-Home or Self Care       Discharge Instructions     Call MD for:  difficulty breathing, headache or visual disturbances   Complete by: As directed    Call MD for:  extreme fatigue   Complete by: As directed    Call MD for:  hives   Complete by: As directed    Call MD for:  persistant dizziness or light-headedness   Complete by: As directed    Call MD for:  persistant nausea and vomiting   Complete by: As directed    Call MD for:  redness, tenderness, or signs of infection (pain, swelling, redness, odor or green/yellow  discharge around incision site)   Complete by: As directed    Call MD for:  severe uncontrolled pain   Complete by: As directed    Call MD for:  temperature >100.4   Complete by: As directed    Diet - low sodium heart healthy   Complete by: As directed    Increase activity slowly   Complete by: As directed    Sexual Activity Restrictions   Complete by: As directed    Pelvic rest x 4 weeks      Allergies as of 02/11/2023       Reactions   Latex Itching, Swelling   Morphine And Related Itching, Nausea And Vomiting        Medication List     STOP taking these medications    cephALEXin 500 MG capsule Commonly known as: KEFLEX   docusate sodium 100 MG capsule Commonly known as: COLACE   enalapril-hydrochlorothiazide 10-25 MG tablet Commonly known as: VASERETIC   HYDROcodone-acetaminophen 5-325 MG tablet Commonly known as: NORCO/VICODIN   hydrocortisone cream 1 %   ondansetron 4 MG disintegrating tablet Commonly known as: Zofran ODT   ondansetron 4 MG tablet Commonly known as: ZOFRAN   phenazopyridine 200 MG tablet Commonly known as: PYRIDIUM       TAKE these medications    ibuprofen 800 MG tablet Commonly known as: ADVIL Take 1 tablet (800 mg total) by mouth 3 (three) times daily.   oxyCODONE-acetaminophen 5-325 MG tablet Commonly known as: PERCOCET/ROXICET Take 1 tablet by mouth every 4 (four) hours as needed  for moderate pain. What changed:  how much to take when to take this additional instructions        [place] [facility] [place] [facility]. Schedule an appointment as soon as possible for a visit in 4 week(s).   Specialty: Obstetrics and Gynecology Why: Post op appt with Dr. Gardiner Fanti Contact information: [place] [phone]                Signed: Chancy Milroy 02/11/2023, 10:02 AM

## 2023-02-14 LAB — SURGICAL PATHOLOGY

## 2023-02-17 MED FILL — Hydromorphone HCl Inj 1 MG/ML: INTRAMUSCULAR | Qty: 0.5 | Status: AC

## 2023-04-05 ENCOUNTER — Ambulatory Visit: Payer: Medicaid Other | Admitting: Obstetrics and Gynecology
